# Patient Record
Sex: Female | Born: 1945 | Race: White | Hispanic: No | Marital: Married | Smoking: Never smoker
Health system: Southern US, Community
[De-identification: ages and names within clinical notes are randomized; demographics above are authoritative.]

## PROBLEM LIST (undated history)

## (undated) DIAGNOSIS — N183 Chronic kidney disease, stage 3 unspecified: Secondary | ICD-10-CM

## (undated) DIAGNOSIS — F0390 Unspecified dementia without behavioral disturbance: Secondary | ICD-10-CM

## (undated) DIAGNOSIS — K219 Gastro-esophageal reflux disease without esophagitis: Secondary | ICD-10-CM

## (undated) DIAGNOSIS — Z982 Presence of cerebrospinal fluid drainage device: Secondary | ICD-10-CM

## (undated) HISTORY — PX: ABDOMINAL HYSTERECTOMY: SHX81

## (undated) HISTORY — PX: LUMBAR FUSION: SHX111

---

## 2016-04-27 DIAGNOSIS — F0390 Unspecified dementia without behavioral disturbance: Secondary | ICD-10-CM | POA: Diagnosis not present

## 2016-07-12 DIAGNOSIS — M9901 Segmental and somatic dysfunction of cervical region: Secondary | ICD-10-CM | POA: Diagnosis not present

## 2016-07-12 DIAGNOSIS — M531 Cervicobrachial syndrome: Secondary | ICD-10-CM | POA: Diagnosis not present

## 2016-07-12 DIAGNOSIS — M5417 Radiculopathy, lumbosacral region: Secondary | ICD-10-CM | POA: Diagnosis not present

## 2016-07-12 DIAGNOSIS — M9903 Segmental and somatic dysfunction of lumbar region: Secondary | ICD-10-CM | POA: Diagnosis not present

## 2016-07-15 DIAGNOSIS — M9903 Segmental and somatic dysfunction of lumbar region: Secondary | ICD-10-CM | POA: Diagnosis not present

## 2016-07-15 DIAGNOSIS — M5417 Radiculopathy, lumbosacral region: Secondary | ICD-10-CM | POA: Diagnosis not present

## 2016-07-15 DIAGNOSIS — M9901 Segmental and somatic dysfunction of cervical region: Secondary | ICD-10-CM | POA: Diagnosis not present

## 2016-07-15 DIAGNOSIS — M531 Cervicobrachial syndrome: Secondary | ICD-10-CM | POA: Diagnosis not present

## 2016-07-19 DIAGNOSIS — M5417 Radiculopathy, lumbosacral region: Secondary | ICD-10-CM | POA: Diagnosis not present

## 2016-07-19 DIAGNOSIS — M531 Cervicobrachial syndrome: Secondary | ICD-10-CM | POA: Diagnosis not present

## 2016-07-19 DIAGNOSIS — M9901 Segmental and somatic dysfunction of cervical region: Secondary | ICD-10-CM | POA: Diagnosis not present

## 2016-07-19 DIAGNOSIS — M9903 Segmental and somatic dysfunction of lumbar region: Secondary | ICD-10-CM | POA: Diagnosis not present

## 2016-07-23 DIAGNOSIS — R072 Precordial pain: Secondary | ICD-10-CM | POA: Diagnosis not present

## 2016-07-23 DIAGNOSIS — Z79899 Other long term (current) drug therapy: Secondary | ICD-10-CM | POA: Diagnosis not present

## 2016-07-23 DIAGNOSIS — E039 Hypothyroidism, unspecified: Secondary | ICD-10-CM | POA: Diagnosis not present

## 2016-07-23 DIAGNOSIS — D1779 Benign lipomatous neoplasm of other sites: Secondary | ICD-10-CM | POA: Diagnosis not present

## 2016-07-23 DIAGNOSIS — R079 Chest pain, unspecified: Secondary | ICD-10-CM | POA: Diagnosis not present

## 2016-07-23 DIAGNOSIS — R0602 Shortness of breath: Secondary | ICD-10-CM | POA: Diagnosis not present

## 2016-07-23 DIAGNOSIS — E785 Hyperlipidemia, unspecified: Secondary | ICD-10-CM | POA: Diagnosis not present

## 2016-07-24 DIAGNOSIS — E785 Hyperlipidemia, unspecified: Secondary | ICD-10-CM | POA: Diagnosis not present

## 2016-07-24 DIAGNOSIS — R0602 Shortness of breath: Secondary | ICD-10-CM | POA: Diagnosis not present

## 2016-07-24 DIAGNOSIS — R079 Chest pain, unspecified: Secondary | ICD-10-CM | POA: Diagnosis not present

## 2016-07-27 DIAGNOSIS — D510 Vitamin B12 deficiency anemia due to intrinsic factor deficiency: Secondary | ICD-10-CM | POA: Diagnosis not present

## 2016-07-27 DIAGNOSIS — R1032 Left lower quadrant pain: Secondary | ICD-10-CM | POA: Diagnosis not present

## 2016-07-28 DIAGNOSIS — M9901 Segmental and somatic dysfunction of cervical region: Secondary | ICD-10-CM | POA: Diagnosis not present

## 2016-07-28 DIAGNOSIS — M9903 Segmental and somatic dysfunction of lumbar region: Secondary | ICD-10-CM | POA: Diagnosis not present

## 2016-07-28 DIAGNOSIS — M5417 Radiculopathy, lumbosacral region: Secondary | ICD-10-CM | POA: Diagnosis not present

## 2016-07-28 DIAGNOSIS — M531 Cervicobrachial syndrome: Secondary | ICD-10-CM | POA: Diagnosis not present

## 2016-08-04 DIAGNOSIS — E279 Disorder of adrenal gland, unspecified: Secondary | ICD-10-CM | POA: Diagnosis not present

## 2016-08-04 DIAGNOSIS — K5730 Diverticulosis of large intestine without perforation or abscess without bleeding: Secondary | ICD-10-CM | POA: Diagnosis not present

## 2016-08-04 DIAGNOSIS — K769 Liver disease, unspecified: Secondary | ICD-10-CM | POA: Diagnosis not present

## 2016-08-04 DIAGNOSIS — R1032 Left lower quadrant pain: Secondary | ICD-10-CM | POA: Diagnosis not present

## 2016-08-04 DIAGNOSIS — N200 Calculus of kidney: Secondary | ICD-10-CM | POA: Diagnosis not present

## 2016-08-04 DIAGNOSIS — I700 Atherosclerosis of aorta: Secondary | ICD-10-CM | POA: Diagnosis not present

## 2016-08-04 DIAGNOSIS — K439 Ventral hernia without obstruction or gangrene: Secondary | ICD-10-CM | POA: Diagnosis not present

## 2016-08-04 DIAGNOSIS — D739 Disease of spleen, unspecified: Secondary | ICD-10-CM | POA: Diagnosis not present

## 2016-08-09 DIAGNOSIS — D1803 Hemangioma of intra-abdominal structures: Secondary | ICD-10-CM | POA: Diagnosis not present

## 2016-08-09 DIAGNOSIS — E279 Disorder of adrenal gland, unspecified: Secondary | ICD-10-CM | POA: Diagnosis not present

## 2016-08-09 DIAGNOSIS — K8689 Other specified diseases of pancreas: Secondary | ICD-10-CM | POA: Diagnosis not present

## 2016-08-09 DIAGNOSIS — K769 Liver disease, unspecified: Secondary | ICD-10-CM | POA: Diagnosis not present

## 2016-08-25 DIAGNOSIS — M9901 Segmental and somatic dysfunction of cervical region: Secondary | ICD-10-CM | POA: Diagnosis not present

## 2016-08-25 DIAGNOSIS — M5417 Radiculopathy, lumbosacral region: Secondary | ICD-10-CM | POA: Diagnosis not present

## 2016-08-25 DIAGNOSIS — M531 Cervicobrachial syndrome: Secondary | ICD-10-CM | POA: Diagnosis not present

## 2016-08-25 DIAGNOSIS — M9903 Segmental and somatic dysfunction of lumbar region: Secondary | ICD-10-CM | POA: Diagnosis not present

## 2016-09-07 DIAGNOSIS — F0390 Unspecified dementia without behavioral disturbance: Secondary | ICD-10-CM | POA: Diagnosis not present

## 2016-09-07 DIAGNOSIS — F0781 Postconcussional syndrome: Secondary | ICD-10-CM | POA: Diagnosis not present

## 2016-09-17 DIAGNOSIS — E230 Hypopituitarism: Secondary | ICD-10-CM | POA: Diagnosis not present

## 2016-09-17 DIAGNOSIS — F0390 Unspecified dementia without behavioral disturbance: Secondary | ICD-10-CM | POA: Diagnosis not present

## 2016-09-17 DIAGNOSIS — K210 Gastro-esophageal reflux disease with esophagitis: Secondary | ICD-10-CM | POA: Diagnosis not present

## 2016-09-17 DIAGNOSIS — D510 Vitamin B12 deficiency anemia due to intrinsic factor deficiency: Secondary | ICD-10-CM | POA: Diagnosis not present

## 2016-09-17 DIAGNOSIS — E039 Hypothyroidism, unspecified: Secondary | ICD-10-CM | POA: Diagnosis not present

## 2016-09-22 DIAGNOSIS — M9901 Segmental and somatic dysfunction of cervical region: Secondary | ICD-10-CM | POA: Diagnosis not present

## 2016-09-22 DIAGNOSIS — M5417 Radiculopathy, lumbosacral region: Secondary | ICD-10-CM | POA: Diagnosis not present

## 2016-09-22 DIAGNOSIS — M9903 Segmental and somatic dysfunction of lumbar region: Secondary | ICD-10-CM | POA: Diagnosis not present

## 2016-09-22 DIAGNOSIS — M531 Cervicobrachial syndrome: Secondary | ICD-10-CM | POA: Diagnosis not present

## 2016-11-30 DIAGNOSIS — M25551 Pain in right hip: Secondary | ICD-10-CM | POA: Diagnosis not present

## 2016-11-30 DIAGNOSIS — M160 Bilateral primary osteoarthritis of hip: Secondary | ICD-10-CM | POA: Diagnosis not present

## 2016-12-02 DIAGNOSIS — M461 Sacroiliitis, not elsewhere classified: Secondary | ICD-10-CM | POA: Diagnosis not present

## 2016-12-06 DIAGNOSIS — M25551 Pain in right hip: Secondary | ICD-10-CM | POA: Diagnosis not present

## 2016-12-06 DIAGNOSIS — M2560 Stiffness of unspecified joint, not elsewhere classified: Secondary | ICD-10-CM | POA: Diagnosis not present

## 2016-12-06 DIAGNOSIS — M6281 Muscle weakness (generalized): Secondary | ICD-10-CM | POA: Diagnosis not present

## 2016-12-09 DIAGNOSIS — M533 Sacrococcygeal disorders, not elsewhere classified: Secondary | ICD-10-CM | POA: Diagnosis not present

## 2016-12-09 DIAGNOSIS — M461 Sacroiliitis, not elsewhere classified: Secondary | ICD-10-CM | POA: Diagnosis not present

## 2016-12-16 DIAGNOSIS — M25551 Pain in right hip: Secondary | ICD-10-CM | POA: Diagnosis not present

## 2016-12-16 DIAGNOSIS — M6281 Muscle weakness (generalized): Secondary | ICD-10-CM | POA: Diagnosis not present

## 2016-12-16 DIAGNOSIS — M2560 Stiffness of unspecified joint, not elsewhere classified: Secondary | ICD-10-CM | POA: Diagnosis not present

## 2016-12-22 DIAGNOSIS — M25551 Pain in right hip: Secondary | ICD-10-CM | POA: Diagnosis not present

## 2016-12-22 DIAGNOSIS — M6281 Muscle weakness (generalized): Secondary | ICD-10-CM | POA: Diagnosis not present

## 2016-12-22 DIAGNOSIS — M2560 Stiffness of unspecified joint, not elsewhere classified: Secondary | ICD-10-CM | POA: Diagnosis not present

## 2016-12-29 DIAGNOSIS — M6281 Muscle weakness (generalized): Secondary | ICD-10-CM | POA: Diagnosis not present

## 2016-12-29 DIAGNOSIS — H2513 Age-related nuclear cataract, bilateral: Secondary | ICD-10-CM | POA: Diagnosis not present

## 2016-12-29 DIAGNOSIS — M25551 Pain in right hip: Secondary | ICD-10-CM | POA: Diagnosis not present

## 2016-12-29 DIAGNOSIS — M2560 Stiffness of unspecified joint, not elsewhere classified: Secondary | ICD-10-CM | POA: Diagnosis not present

## 2016-12-31 DIAGNOSIS — M25551 Pain in right hip: Secondary | ICD-10-CM | POA: Diagnosis not present

## 2016-12-31 DIAGNOSIS — M2560 Stiffness of unspecified joint, not elsewhere classified: Secondary | ICD-10-CM | POA: Diagnosis not present

## 2016-12-31 DIAGNOSIS — M6281 Muscle weakness (generalized): Secondary | ICD-10-CM | POA: Diagnosis not present

## 2017-01-07 DIAGNOSIS — M6281 Muscle weakness (generalized): Secondary | ICD-10-CM | POA: Diagnosis not present

## 2017-01-07 DIAGNOSIS — M2560 Stiffness of unspecified joint, not elsewhere classified: Secondary | ICD-10-CM | POA: Diagnosis not present

## 2017-01-07 DIAGNOSIS — M25551 Pain in right hip: Secondary | ICD-10-CM | POA: Diagnosis not present

## 2017-01-12 DIAGNOSIS — M2560 Stiffness of unspecified joint, not elsewhere classified: Secondary | ICD-10-CM | POA: Diagnosis not present

## 2017-01-12 DIAGNOSIS — M6281 Muscle weakness (generalized): Secondary | ICD-10-CM | POA: Diagnosis not present

## 2017-01-12 DIAGNOSIS — M25551 Pain in right hip: Secondary | ICD-10-CM | POA: Diagnosis not present

## 2017-01-13 DIAGNOSIS — M461 Sacroiliitis, not elsewhere classified: Secondary | ICD-10-CM | POA: Diagnosis not present

## 2017-01-17 DIAGNOSIS — E039 Hypothyroidism, unspecified: Secondary | ICD-10-CM | POA: Diagnosis not present

## 2017-01-17 DIAGNOSIS — Z79899 Other long term (current) drug therapy: Secondary | ICD-10-CM | POA: Diagnosis not present

## 2017-01-17 DIAGNOSIS — D510 Vitamin B12 deficiency anemia due to intrinsic factor deficiency: Secondary | ICD-10-CM | POA: Diagnosis not present

## 2017-01-17 DIAGNOSIS — Z982 Presence of cerebrospinal fluid drainage device: Secondary | ICD-10-CM | POA: Diagnosis not present

## 2017-01-17 DIAGNOSIS — Z23 Encounter for immunization: Secondary | ICD-10-CM | POA: Diagnosis not present

## 2017-01-17 DIAGNOSIS — E2740 Unspecified adrenocortical insufficiency: Secondary | ICD-10-CM | POA: Diagnosis not present

## 2017-01-26 DIAGNOSIS — Z01818 Encounter for other preprocedural examination: Secondary | ICD-10-CM | POA: Diagnosis not present

## 2017-01-26 DIAGNOSIS — H35372 Puckering of macula, left eye: Secondary | ICD-10-CM | POA: Diagnosis not present

## 2017-01-26 DIAGNOSIS — H2512 Age-related nuclear cataract, left eye: Secondary | ICD-10-CM | POA: Diagnosis not present

## 2017-01-26 DIAGNOSIS — H25812 Combined forms of age-related cataract, left eye: Secondary | ICD-10-CM | POA: Diagnosis not present

## 2017-01-27 DIAGNOSIS — H25812 Combined forms of age-related cataract, left eye: Secondary | ICD-10-CM | POA: Diagnosis not present

## 2017-02-01 DIAGNOSIS — M9901 Segmental and somatic dysfunction of cervical region: Secondary | ICD-10-CM | POA: Diagnosis not present

## 2017-02-01 DIAGNOSIS — M531 Cervicobrachial syndrome: Secondary | ICD-10-CM | POA: Diagnosis not present

## 2017-02-01 DIAGNOSIS — M9903 Segmental and somatic dysfunction of lumbar region: Secondary | ICD-10-CM | POA: Diagnosis not present

## 2017-02-01 DIAGNOSIS — M5417 Radiculopathy, lumbosacral region: Secondary | ICD-10-CM | POA: Diagnosis not present

## 2017-02-07 DIAGNOSIS — H2511 Age-related nuclear cataract, right eye: Secondary | ICD-10-CM | POA: Diagnosis not present

## 2017-02-07 DIAGNOSIS — H25811 Combined forms of age-related cataract, right eye: Secondary | ICD-10-CM | POA: Diagnosis not present

## 2017-03-15 DIAGNOSIS — Z6828 Body mass index (BMI) 28.0-28.9, adult: Secondary | ICD-10-CM | POA: Diagnosis not present

## 2017-03-15 DIAGNOSIS — E2740 Unspecified adrenocortical insufficiency: Secondary | ICD-10-CM | POA: Diagnosis not present

## 2017-03-15 DIAGNOSIS — Z760 Encounter for issue of repeat prescription: Secondary | ICD-10-CM | POA: Diagnosis not present

## 2017-03-15 DIAGNOSIS — E039 Hypothyroidism, unspecified: Secondary | ICD-10-CM | POA: Diagnosis not present

## 2017-03-28 DIAGNOSIS — E279 Disorder of adrenal gland, unspecified: Secondary | ICD-10-CM | POA: Diagnosis not present

## 2017-03-28 DIAGNOSIS — E2740 Unspecified adrenocortical insufficiency: Secondary | ICD-10-CM | POA: Diagnosis not present

## 2017-03-28 DIAGNOSIS — E039 Hypothyroidism, unspecified: Secondary | ICD-10-CM | POA: Diagnosis not present

## 2017-04-29 DIAGNOSIS — F0781 Postconcussional syndrome: Secondary | ICD-10-CM | POA: Diagnosis not present

## 2017-04-29 DIAGNOSIS — R413 Other amnesia: Secondary | ICD-10-CM | POA: Diagnosis not present

## 2017-06-22 DIAGNOSIS — M9902 Segmental and somatic dysfunction of thoracic region: Secondary | ICD-10-CM | POA: Diagnosis not present

## 2017-06-22 DIAGNOSIS — M47816 Spondylosis without myelopathy or radiculopathy, lumbar region: Secondary | ICD-10-CM | POA: Diagnosis not present

## 2017-06-22 DIAGNOSIS — M9901 Segmental and somatic dysfunction of cervical region: Secondary | ICD-10-CM | POA: Diagnosis not present

## 2017-06-22 DIAGNOSIS — M9903 Segmental and somatic dysfunction of lumbar region: Secondary | ICD-10-CM | POA: Diagnosis not present

## 2017-06-23 DIAGNOSIS — M9901 Segmental and somatic dysfunction of cervical region: Secondary | ICD-10-CM | POA: Diagnosis not present

## 2017-06-23 DIAGNOSIS — M47816 Spondylosis without myelopathy or radiculopathy, lumbar region: Secondary | ICD-10-CM | POA: Diagnosis not present

## 2017-06-23 DIAGNOSIS — M9903 Segmental and somatic dysfunction of lumbar region: Secondary | ICD-10-CM | POA: Diagnosis not present

## 2017-06-23 DIAGNOSIS — M9902 Segmental and somatic dysfunction of thoracic region: Secondary | ICD-10-CM | POA: Diagnosis not present

## 2017-06-27 DIAGNOSIS — M9902 Segmental and somatic dysfunction of thoracic region: Secondary | ICD-10-CM | POA: Diagnosis not present

## 2017-06-27 DIAGNOSIS — M9901 Segmental and somatic dysfunction of cervical region: Secondary | ICD-10-CM | POA: Diagnosis not present

## 2017-06-27 DIAGNOSIS — M9903 Segmental and somatic dysfunction of lumbar region: Secondary | ICD-10-CM | POA: Diagnosis not present

## 2017-06-27 DIAGNOSIS — M47816 Spondylosis without myelopathy or radiculopathy, lumbar region: Secondary | ICD-10-CM | POA: Diagnosis not present

## 2017-06-28 DIAGNOSIS — M47816 Spondylosis without myelopathy or radiculopathy, lumbar region: Secondary | ICD-10-CM | POA: Diagnosis not present

## 2017-06-28 DIAGNOSIS — M9902 Segmental and somatic dysfunction of thoracic region: Secondary | ICD-10-CM | POA: Diagnosis not present

## 2017-06-28 DIAGNOSIS — M9901 Segmental and somatic dysfunction of cervical region: Secondary | ICD-10-CM | POA: Diagnosis not present

## 2017-06-28 DIAGNOSIS — M9903 Segmental and somatic dysfunction of lumbar region: Secondary | ICD-10-CM | POA: Diagnosis not present

## 2017-06-29 DIAGNOSIS — M9903 Segmental and somatic dysfunction of lumbar region: Secondary | ICD-10-CM | POA: Diagnosis not present

## 2017-06-29 DIAGNOSIS — M9901 Segmental and somatic dysfunction of cervical region: Secondary | ICD-10-CM | POA: Diagnosis not present

## 2017-06-29 DIAGNOSIS — M9902 Segmental and somatic dysfunction of thoracic region: Secondary | ICD-10-CM | POA: Diagnosis not present

## 2017-06-29 DIAGNOSIS — M47816 Spondylosis without myelopathy or radiculopathy, lumbar region: Secondary | ICD-10-CM | POA: Diagnosis not present

## 2017-07-04 DIAGNOSIS — M9903 Segmental and somatic dysfunction of lumbar region: Secondary | ICD-10-CM | POA: Diagnosis not present

## 2017-07-04 DIAGNOSIS — M47816 Spondylosis without myelopathy or radiculopathy, lumbar region: Secondary | ICD-10-CM | POA: Diagnosis not present

## 2017-07-04 DIAGNOSIS — M9902 Segmental and somatic dysfunction of thoracic region: Secondary | ICD-10-CM | POA: Diagnosis not present

## 2017-07-04 DIAGNOSIS — M9901 Segmental and somatic dysfunction of cervical region: Secondary | ICD-10-CM | POA: Diagnosis not present

## 2017-07-05 DIAGNOSIS — M9903 Segmental and somatic dysfunction of lumbar region: Secondary | ICD-10-CM | POA: Diagnosis not present

## 2017-07-05 DIAGNOSIS — M47816 Spondylosis without myelopathy or radiculopathy, lumbar region: Secondary | ICD-10-CM | POA: Diagnosis not present

## 2017-07-05 DIAGNOSIS — M9901 Segmental and somatic dysfunction of cervical region: Secondary | ICD-10-CM | POA: Diagnosis not present

## 2017-07-05 DIAGNOSIS — M9902 Segmental and somatic dysfunction of thoracic region: Secondary | ICD-10-CM | POA: Diagnosis not present

## 2017-07-06 DIAGNOSIS — M47816 Spondylosis without myelopathy or radiculopathy, lumbar region: Secondary | ICD-10-CM | POA: Diagnosis not present

## 2017-07-06 DIAGNOSIS — M9903 Segmental and somatic dysfunction of lumbar region: Secondary | ICD-10-CM | POA: Diagnosis not present

## 2017-07-06 DIAGNOSIS — M9902 Segmental and somatic dysfunction of thoracic region: Secondary | ICD-10-CM | POA: Diagnosis not present

## 2017-07-06 DIAGNOSIS — M9901 Segmental and somatic dysfunction of cervical region: Secondary | ICD-10-CM | POA: Diagnosis not present

## 2017-07-11 DIAGNOSIS — M9902 Segmental and somatic dysfunction of thoracic region: Secondary | ICD-10-CM | POA: Diagnosis not present

## 2017-07-11 DIAGNOSIS — M47816 Spondylosis without myelopathy or radiculopathy, lumbar region: Secondary | ICD-10-CM | POA: Diagnosis not present

## 2017-07-11 DIAGNOSIS — M9901 Segmental and somatic dysfunction of cervical region: Secondary | ICD-10-CM | POA: Diagnosis not present

## 2017-07-11 DIAGNOSIS — M9903 Segmental and somatic dysfunction of lumbar region: Secondary | ICD-10-CM | POA: Diagnosis not present

## 2017-07-13 DIAGNOSIS — M47816 Spondylosis without myelopathy or radiculopathy, lumbar region: Secondary | ICD-10-CM | POA: Diagnosis not present

## 2017-07-13 DIAGNOSIS — M9903 Segmental and somatic dysfunction of lumbar region: Secondary | ICD-10-CM | POA: Diagnosis not present

## 2017-07-13 DIAGNOSIS — M9902 Segmental and somatic dysfunction of thoracic region: Secondary | ICD-10-CM | POA: Diagnosis not present

## 2017-07-13 DIAGNOSIS — M9901 Segmental and somatic dysfunction of cervical region: Secondary | ICD-10-CM | POA: Diagnosis not present

## 2017-07-18 DIAGNOSIS — M47816 Spondylosis without myelopathy or radiculopathy, lumbar region: Secondary | ICD-10-CM | POA: Diagnosis not present

## 2017-07-18 DIAGNOSIS — M9901 Segmental and somatic dysfunction of cervical region: Secondary | ICD-10-CM | POA: Diagnosis not present

## 2017-07-18 DIAGNOSIS — M9903 Segmental and somatic dysfunction of lumbar region: Secondary | ICD-10-CM | POA: Diagnosis not present

## 2017-07-18 DIAGNOSIS — M9902 Segmental and somatic dysfunction of thoracic region: Secondary | ICD-10-CM | POA: Diagnosis not present

## 2017-07-20 DIAGNOSIS — M9903 Segmental and somatic dysfunction of lumbar region: Secondary | ICD-10-CM | POA: Diagnosis not present

## 2017-07-20 DIAGNOSIS — M9902 Segmental and somatic dysfunction of thoracic region: Secondary | ICD-10-CM | POA: Diagnosis not present

## 2017-07-20 DIAGNOSIS — M9901 Segmental and somatic dysfunction of cervical region: Secondary | ICD-10-CM | POA: Diagnosis not present

## 2017-07-20 DIAGNOSIS — M47816 Spondylosis without myelopathy or radiculopathy, lumbar region: Secondary | ICD-10-CM | POA: Diagnosis not present

## 2017-07-25 DIAGNOSIS — M9902 Segmental and somatic dysfunction of thoracic region: Secondary | ICD-10-CM | POA: Diagnosis not present

## 2017-07-25 DIAGNOSIS — M9901 Segmental and somatic dysfunction of cervical region: Secondary | ICD-10-CM | POA: Diagnosis not present

## 2017-07-25 DIAGNOSIS — M47816 Spondylosis without myelopathy or radiculopathy, lumbar region: Secondary | ICD-10-CM | POA: Diagnosis not present

## 2017-07-25 DIAGNOSIS — M9903 Segmental and somatic dysfunction of lumbar region: Secondary | ICD-10-CM | POA: Diagnosis not present

## 2017-09-06 DIAGNOSIS — E2740 Unspecified adrenocortical insufficiency: Secondary | ICD-10-CM | POA: Diagnosis not present

## 2017-09-06 DIAGNOSIS — Z79899 Other long term (current) drug therapy: Secondary | ICD-10-CM | POA: Diagnosis not present

## 2017-09-06 DIAGNOSIS — D510 Vitamin B12 deficiency anemia due to intrinsic factor deficiency: Secondary | ICD-10-CM | POA: Diagnosis not present

## 2017-09-06 DIAGNOSIS — Z982 Presence of cerebrospinal fluid drainage device: Secondary | ICD-10-CM | POA: Diagnosis not present

## 2017-09-06 DIAGNOSIS — E039 Hypothyroidism, unspecified: Secondary | ICD-10-CM | POA: Diagnosis not present

## 2017-09-06 DIAGNOSIS — K219 Gastro-esophageal reflux disease without esophagitis: Secondary | ICD-10-CM | POA: Diagnosis not present

## 2017-09-26 DIAGNOSIS — E039 Hypothyroidism, unspecified: Secondary | ICD-10-CM | POA: Diagnosis not present

## 2017-09-26 DIAGNOSIS — E2740 Unspecified adrenocortical insufficiency: Secondary | ICD-10-CM | POA: Diagnosis not present

## 2017-11-15 DIAGNOSIS — Z0000 Encounter for general adult medical examination without abnormal findings: Secondary | ICD-10-CM | POA: Diagnosis not present

## 2017-11-15 DIAGNOSIS — R05 Cough: Secondary | ICD-10-CM | POA: Diagnosis not present

## 2017-11-15 DIAGNOSIS — Z6829 Body mass index (BMI) 29.0-29.9, adult: Secondary | ICD-10-CM | POA: Diagnosis not present

## 2017-11-15 DIAGNOSIS — D510 Vitamin B12 deficiency anemia due to intrinsic factor deficiency: Secondary | ICD-10-CM | POA: Diagnosis not present

## 2017-11-30 DIAGNOSIS — M4316 Spondylolisthesis, lumbar region: Secondary | ICD-10-CM | POA: Diagnosis not present

## 2017-12-05 DIAGNOSIS — M545 Low back pain: Secondary | ICD-10-CM | POA: Diagnosis not present

## 2017-12-05 DIAGNOSIS — M4316 Spondylolisthesis, lumbar region: Secondary | ICD-10-CM | POA: Diagnosis not present

## 2017-12-05 DIAGNOSIS — M5116 Intervertebral disc disorders with radiculopathy, lumbar region: Secondary | ICD-10-CM | POA: Diagnosis not present

## 2017-12-05 DIAGNOSIS — M4800 Spinal stenosis, site unspecified: Secondary | ICD-10-CM | POA: Diagnosis not present

## 2017-12-07 DIAGNOSIS — M4316 Spondylolisthesis, lumbar region: Secondary | ICD-10-CM | POA: Diagnosis not present

## 2017-12-07 DIAGNOSIS — M5416 Radiculopathy, lumbar region: Secondary | ICD-10-CM | POA: Diagnosis not present

## 2017-12-09 DIAGNOSIS — M5416 Radiculopathy, lumbar region: Secondary | ICD-10-CM | POA: Diagnosis not present

## 2017-12-09 DIAGNOSIS — Z6830 Body mass index (BMI) 30.0-30.9, adult: Secondary | ICD-10-CM | POA: Diagnosis not present

## 2017-12-14 DIAGNOSIS — Z79899 Other long term (current) drug therapy: Secondary | ICD-10-CM | POA: Diagnosis not present

## 2017-12-14 DIAGNOSIS — D510 Vitamin B12 deficiency anemia due to intrinsic factor deficiency: Secondary | ICD-10-CM | POA: Diagnosis not present

## 2017-12-14 DIAGNOSIS — R52 Pain, unspecified: Secondary | ICD-10-CM | POA: Diagnosis not present

## 2017-12-14 DIAGNOSIS — Z01818 Encounter for other preprocedural examination: Secondary | ICD-10-CM | POA: Diagnosis not present

## 2017-12-14 DIAGNOSIS — E559 Vitamin D deficiency, unspecified: Secondary | ICD-10-CM | POA: Diagnosis not present

## 2017-12-14 DIAGNOSIS — M79603 Pain in arm, unspecified: Secondary | ICD-10-CM | POA: Diagnosis not present

## 2018-01-02 DIAGNOSIS — F320 Major depressive disorder, single episode, mild: Secondary | ICD-10-CM | POA: Diagnosis not present

## 2018-01-02 DIAGNOSIS — M461 Sacroiliitis, not elsewhere classified: Secondary | ICD-10-CM | POA: Diagnosis not present

## 2018-01-02 DIAGNOSIS — F329 Major depressive disorder, single episode, unspecified: Secondary | ICD-10-CM | POA: Diagnosis not present

## 2018-01-02 DIAGNOSIS — E039 Hypothyroidism, unspecified: Secondary | ICD-10-CM | POA: Diagnosis present

## 2018-01-02 DIAGNOSIS — Z01818 Encounter for other preprocedural examination: Secondary | ICD-10-CM | POA: Diagnosis not present

## 2018-01-02 DIAGNOSIS — Z79891 Long term (current) use of opiate analgesic: Secondary | ICD-10-CM | POA: Diagnosis not present

## 2018-01-02 DIAGNOSIS — M48061 Spinal stenosis, lumbar region without neurogenic claudication: Secondary | ICD-10-CM | POA: Diagnosis present

## 2018-01-02 DIAGNOSIS — M48062 Spinal stenosis, lumbar region with neurogenic claudication: Secondary | ICD-10-CM | POA: Diagnosis not present

## 2018-01-02 DIAGNOSIS — Z87828 Personal history of other (healed) physical injury and trauma: Secondary | ICD-10-CM | POA: Diagnosis not present

## 2018-01-02 DIAGNOSIS — Z7401 Bed confinement status: Secondary | ICD-10-CM | POA: Diagnosis not present

## 2018-01-02 DIAGNOSIS — M5416 Radiculopathy, lumbar region: Secondary | ICD-10-CM | POA: Diagnosis not present

## 2018-01-02 DIAGNOSIS — K219 Gastro-esophageal reflux disease without esophagitis: Secondary | ICD-10-CM | POA: Diagnosis present

## 2018-01-02 DIAGNOSIS — M818 Other osteoporosis without current pathological fracture: Secondary | ICD-10-CM | POA: Diagnosis not present

## 2018-01-02 DIAGNOSIS — M47816 Spondylosis without myelopathy or radiculopathy, lumbar region: Secondary | ICD-10-CM | POA: Diagnosis present

## 2018-01-02 DIAGNOSIS — M5116 Intervertebral disc disorders with radiculopathy, lumbar region: Secondary | ICD-10-CM | POA: Diagnosis present

## 2018-01-02 DIAGNOSIS — E785 Hyperlipidemia, unspecified: Secondary | ICD-10-CM | POA: Diagnosis not present

## 2018-01-02 DIAGNOSIS — F0390 Unspecified dementia without behavioral disturbance: Secondary | ICD-10-CM | POA: Diagnosis present

## 2018-01-02 DIAGNOSIS — D649 Anemia, unspecified: Secondary | ICD-10-CM | POA: Diagnosis not present

## 2018-01-02 DIAGNOSIS — E230 Hypopituitarism: Secondary | ICD-10-CM | POA: Diagnosis not present

## 2018-01-02 DIAGNOSIS — Z8719 Personal history of other diseases of the digestive system: Secondary | ICD-10-CM | POA: Diagnosis not present

## 2018-01-02 DIAGNOSIS — M4316 Spondylolisthesis, lumbar region: Secondary | ICD-10-CM | POA: Diagnosis present

## 2018-01-02 DIAGNOSIS — Z79899 Other long term (current) drug therapy: Secondary | ICD-10-CM | POA: Diagnosis not present

## 2018-01-02 DIAGNOSIS — E279 Disorder of adrenal gland, unspecified: Secondary | ICD-10-CM | POA: Diagnosis not present

## 2018-01-02 DIAGNOSIS — R5381 Other malaise: Secondary | ICD-10-CM | POA: Diagnosis not present

## 2018-01-05 DIAGNOSIS — G8918 Other acute postprocedural pain: Secondary | ICD-10-CM | POA: Diagnosis not present

## 2018-01-05 DIAGNOSIS — F0390 Unspecified dementia without behavioral disturbance: Secondary | ICD-10-CM | POA: Diagnosis not present

## 2018-01-05 DIAGNOSIS — Z8719 Personal history of other diseases of the digestive system: Secondary | ICD-10-CM | POA: Diagnosis not present

## 2018-01-05 DIAGNOSIS — E230 Hypopituitarism: Secondary | ICD-10-CM | POA: Diagnosis not present

## 2018-01-05 DIAGNOSIS — M549 Dorsalgia, unspecified: Secondary | ICD-10-CM | POA: Diagnosis not present

## 2018-01-05 DIAGNOSIS — D649 Anemia, unspecified: Secondary | ICD-10-CM | POA: Diagnosis not present

## 2018-01-05 DIAGNOSIS — E279 Disorder of adrenal gland, unspecified: Secondary | ICD-10-CM | POA: Diagnosis not present

## 2018-01-05 DIAGNOSIS — K219 Gastro-esophageal reflux disease without esophagitis: Secondary | ICD-10-CM | POA: Diagnosis not present

## 2018-01-05 DIAGNOSIS — Z01818 Encounter for other preprocedural examination: Secondary | ICD-10-CM | POA: Diagnosis not present

## 2018-01-05 DIAGNOSIS — M4316 Spondylolisthesis, lumbar region: Secondary | ICD-10-CM | POA: Diagnosis not present

## 2018-01-05 DIAGNOSIS — R262 Difficulty in walking, not elsewhere classified: Secondary | ICD-10-CM | POA: Diagnosis not present

## 2018-01-05 DIAGNOSIS — E785 Hyperlipidemia, unspecified: Secondary | ICD-10-CM | POA: Diagnosis not present

## 2018-01-05 DIAGNOSIS — Z7401 Bed confinement status: Secondary | ICD-10-CM | POA: Diagnosis not present

## 2018-01-05 DIAGNOSIS — M5416 Radiculopathy, lumbar region: Secondary | ICD-10-CM | POA: Diagnosis not present

## 2018-01-05 DIAGNOSIS — F329 Major depressive disorder, single episode, unspecified: Secondary | ICD-10-CM | POA: Diagnosis not present

## 2018-01-05 DIAGNOSIS — M461 Sacroiliitis, not elsewhere classified: Secondary | ICD-10-CM | POA: Diagnosis not present

## 2018-01-05 DIAGNOSIS — F320 Major depressive disorder, single episode, mild: Secondary | ICD-10-CM | POA: Diagnosis not present

## 2018-01-05 DIAGNOSIS — E039 Hypothyroidism, unspecified: Secondary | ICD-10-CM | POA: Diagnosis not present

## 2018-01-05 DIAGNOSIS — M818 Other osteoporosis without current pathological fracture: Secondary | ICD-10-CM | POA: Diagnosis not present

## 2018-01-05 DIAGNOSIS — R5381 Other malaise: Secondary | ICD-10-CM | POA: Diagnosis not present

## 2018-01-06 DIAGNOSIS — M549 Dorsalgia, unspecified: Secondary | ICD-10-CM | POA: Diagnosis not present

## 2018-01-06 DIAGNOSIS — G8918 Other acute postprocedural pain: Secondary | ICD-10-CM | POA: Diagnosis not present

## 2018-01-06 DIAGNOSIS — R262 Difficulty in walking, not elsewhere classified: Secondary | ICD-10-CM | POA: Diagnosis not present

## 2018-01-06 DIAGNOSIS — M5416 Radiculopathy, lumbar region: Secondary | ICD-10-CM | POA: Diagnosis not present

## 2018-01-20 DIAGNOSIS — M4316 Spondylolisthesis, lumbar region: Secondary | ICD-10-CM | POA: Diagnosis not present

## 2018-01-23 DIAGNOSIS — F0390 Unspecified dementia without behavioral disturbance: Secondary | ICD-10-CM | POA: Diagnosis not present

## 2018-01-23 DIAGNOSIS — E039 Hypothyroidism, unspecified: Secondary | ICD-10-CM | POA: Diagnosis not present

## 2018-01-23 DIAGNOSIS — Z4789 Encounter for other orthopedic aftercare: Secondary | ICD-10-CM | POA: Diagnosis not present

## 2018-01-23 DIAGNOSIS — M810 Age-related osteoporosis without current pathological fracture: Secondary | ICD-10-CM | POA: Diagnosis not present

## 2018-01-23 DIAGNOSIS — D649 Anemia, unspecified: Secondary | ICD-10-CM | POA: Diagnosis not present

## 2018-01-23 DIAGNOSIS — M48062 Spinal stenosis, lumbar region with neurogenic claudication: Secondary | ICD-10-CM | POA: Diagnosis not present

## 2018-01-24 DIAGNOSIS — M4316 Spondylolisthesis, lumbar region: Secondary | ICD-10-CM | POA: Diagnosis not present

## 2018-01-24 DIAGNOSIS — M5416 Radiculopathy, lumbar region: Secondary | ICD-10-CM | POA: Diagnosis not present

## 2018-01-25 DIAGNOSIS — F0390 Unspecified dementia without behavioral disturbance: Secondary | ICD-10-CM | POA: Diagnosis not present

## 2018-01-25 DIAGNOSIS — E039 Hypothyroidism, unspecified: Secondary | ICD-10-CM | POA: Diagnosis not present

## 2018-01-25 DIAGNOSIS — D649 Anemia, unspecified: Secondary | ICD-10-CM | POA: Diagnosis not present

## 2018-01-25 DIAGNOSIS — M48062 Spinal stenosis, lumbar region with neurogenic claudication: Secondary | ICD-10-CM | POA: Diagnosis not present

## 2018-01-25 DIAGNOSIS — M810 Age-related osteoporosis without current pathological fracture: Secondary | ICD-10-CM | POA: Diagnosis not present

## 2018-01-25 DIAGNOSIS — Z4789 Encounter for other orthopedic aftercare: Secondary | ICD-10-CM | POA: Diagnosis not present

## 2018-01-26 DIAGNOSIS — I96 Gangrene, not elsewhere classified: Secondary | ICD-10-CM | POA: Diagnosis present

## 2018-01-26 DIAGNOSIS — E039 Hypothyroidism, unspecified: Secondary | ICD-10-CM | POA: Diagnosis present

## 2018-01-26 DIAGNOSIS — Z7401 Bed confinement status: Secondary | ICD-10-CM | POA: Diagnosis not present

## 2018-01-26 DIAGNOSIS — R0989 Other specified symptoms and signs involving the circulatory and respiratory systems: Secondary | ICD-10-CM | POA: Diagnosis not present

## 2018-01-26 DIAGNOSIS — Y831 Surgical operation with implant of artificial internal device as the cause of abnormal reaction of the patient, or of later complication, without mention of misadventure at the time of the procedure: Secondary | ICD-10-CM | POA: Diagnosis not present

## 2018-01-26 DIAGNOSIS — M1611 Unilateral primary osteoarthritis, right hip: Secondary | ICD-10-CM | POA: Diagnosis not present

## 2018-01-26 DIAGNOSIS — Z981 Arthrodesis status: Secondary | ICD-10-CM | POA: Diagnosis not present

## 2018-01-26 DIAGNOSIS — M4316 Spondylolisthesis, lumbar region: Secondary | ICD-10-CM | POA: Diagnosis not present

## 2018-01-26 DIAGNOSIS — T8149XD Infection following a procedure, other surgical site, subsequent encounter: Secondary | ICD-10-CM | POA: Diagnosis not present

## 2018-01-26 DIAGNOSIS — R2681 Unsteadiness on feet: Secondary | ICD-10-CM | POA: Diagnosis not present

## 2018-01-26 DIAGNOSIS — F339 Major depressive disorder, recurrent, unspecified: Secondary | ICD-10-CM | POA: Diagnosis not present

## 2018-01-26 DIAGNOSIS — R2689 Other abnormalities of gait and mobility: Secondary | ICD-10-CM | POA: Diagnosis not present

## 2018-01-26 DIAGNOSIS — M2550 Pain in unspecified joint: Secondary | ICD-10-CM | POA: Diagnosis not present

## 2018-01-26 DIAGNOSIS — M6281 Muscle weakness (generalized): Secondary | ICD-10-CM | POA: Diagnosis not present

## 2018-01-26 DIAGNOSIS — E7800 Pure hypercholesterolemia, unspecified: Secondary | ICD-10-CM | POA: Diagnosis not present

## 2018-01-26 DIAGNOSIS — Z452 Encounter for adjustment and management of vascular access device: Secondary | ICD-10-CM | POA: Diagnosis not present

## 2018-01-26 DIAGNOSIS — T8149XA Infection following a procedure, other surgical site, initial encounter: Secondary | ICD-10-CM | POA: Diagnosis present

## 2018-01-26 DIAGNOSIS — Z741 Need for assistance with personal care: Secondary | ICD-10-CM | POA: Diagnosis not present

## 2018-01-26 DIAGNOSIS — Z79899 Other long term (current) drug therapy: Secondary | ICD-10-CM | POA: Diagnosis not present

## 2018-01-26 DIAGNOSIS — K219 Gastro-esophageal reflux disease without esophagitis: Secondary | ICD-10-CM | POA: Diagnosis present

## 2018-01-26 DIAGNOSIS — E785 Hyperlipidemia, unspecified: Secondary | ICD-10-CM | POA: Diagnosis present

## 2018-01-26 DIAGNOSIS — R41841 Cognitive communication deficit: Secondary | ICD-10-CM | POA: Diagnosis not present

## 2018-01-26 DIAGNOSIS — Z4789 Encounter for other orthopedic aftercare: Secondary | ICD-10-CM | POA: Diagnosis not present

## 2018-01-26 DIAGNOSIS — B9561 Methicillin susceptible Staphylococcus aureus infection as the cause of diseases classified elsewhere: Secondary | ICD-10-CM | POA: Diagnosis present

## 2018-01-26 DIAGNOSIS — B954 Other streptococcus as the cause of diseases classified elsewhere: Secondary | ICD-10-CM | POA: Diagnosis present

## 2018-01-26 DIAGNOSIS — T8132XA Disruption of internal operation (surgical) wound, not elsewhere classified, initial encounter: Secondary | ICD-10-CM | POA: Diagnosis not present

## 2018-01-26 DIAGNOSIS — F0390 Unspecified dementia without behavioral disturbance: Secondary | ICD-10-CM | POA: Diagnosis not present

## 2018-01-26 DIAGNOSIS — M545 Low back pain: Secondary | ICD-10-CM | POA: Diagnosis not present

## 2018-01-26 DIAGNOSIS — Z79891 Long term (current) use of opiate analgesic: Secondary | ICD-10-CM | POA: Diagnosis not present

## 2018-01-27 DIAGNOSIS — Y831 Surgical operation with implant of artificial internal device as the cause of abnormal reaction of the patient, or of later complication, without mention of misadventure at the time of the procedure: Secondary | ICD-10-CM | POA: Diagnosis not present

## 2018-01-27 DIAGNOSIS — I96 Gangrene, not elsewhere classified: Secondary | ICD-10-CM | POA: Diagnosis not present

## 2018-01-27 DIAGNOSIS — T8149XA Infection following a procedure, other surgical site, initial encounter: Secondary | ICD-10-CM | POA: Diagnosis not present

## 2018-01-28 DIAGNOSIS — I96 Gangrene, not elsewhere classified: Secondary | ICD-10-CM | POA: Diagnosis not present

## 2018-01-28 DIAGNOSIS — Y831 Surgical operation with implant of artificial internal device as the cause of abnormal reaction of the patient, or of later complication, without mention of misadventure at the time of the procedure: Secondary | ICD-10-CM | POA: Diagnosis not present

## 2018-01-28 DIAGNOSIS — T8149XA Infection following a procedure, other surgical site, initial encounter: Secondary | ICD-10-CM | POA: Diagnosis not present

## 2018-01-30 DIAGNOSIS — M2550 Pain in unspecified joint: Secondary | ICD-10-CM | POA: Diagnosis not present

## 2018-01-30 DIAGNOSIS — M4316 Spondylolisthesis, lumbar region: Secondary | ICD-10-CM | POA: Diagnosis not present

## 2018-01-30 DIAGNOSIS — Z4789 Encounter for other orthopedic aftercare: Secondary | ICD-10-CM | POA: Diagnosis not present

## 2018-01-30 DIAGNOSIS — M1611 Unilateral primary osteoarthritis, right hip: Secondary | ICD-10-CM | POA: Diagnosis not present

## 2018-01-30 DIAGNOSIS — M47816 Spondylosis without myelopathy or radiculopathy, lumbar region: Secondary | ICD-10-CM | POA: Diagnosis not present

## 2018-01-30 DIAGNOSIS — E039 Hypothyroidism, unspecified: Secondary | ICD-10-CM | POA: Diagnosis not present

## 2018-01-30 DIAGNOSIS — R2681 Unsteadiness on feet: Secondary | ICD-10-CM | POA: Diagnosis not present

## 2018-01-30 DIAGNOSIS — D649 Anemia, unspecified: Secondary | ICD-10-CM | POA: Diagnosis not present

## 2018-01-30 DIAGNOSIS — T8463XD Infection and inflammatory reaction due to internal fixation device of spine, subsequent encounter: Secondary | ICD-10-CM | POA: Diagnosis not present

## 2018-01-30 DIAGNOSIS — M6281 Muscle weakness (generalized): Secondary | ICD-10-CM | POA: Diagnosis not present

## 2018-01-30 DIAGNOSIS — M545 Low back pain: Secondary | ICD-10-CM | POA: Diagnosis not present

## 2018-01-30 DIAGNOSIS — R5383 Other fatigue: Secondary | ICD-10-CM | POA: Diagnosis not present

## 2018-01-30 DIAGNOSIS — Z981 Arthrodesis status: Secondary | ICD-10-CM | POA: Diagnosis not present

## 2018-01-30 DIAGNOSIS — M549 Dorsalgia, unspecified: Secondary | ICD-10-CM | POA: Diagnosis not present

## 2018-01-30 DIAGNOSIS — R41841 Cognitive communication deficit: Secondary | ICD-10-CM | POA: Diagnosis not present

## 2018-01-30 DIAGNOSIS — F0390 Unspecified dementia without behavioral disturbance: Secondary | ICD-10-CM | POA: Diagnosis not present

## 2018-01-30 DIAGNOSIS — T8142XD Infection following a procedure, deep incisional surgical site, subsequent encounter: Secondary | ICD-10-CM | POA: Diagnosis not present

## 2018-01-30 DIAGNOSIS — R2689 Other abnormalities of gait and mobility: Secondary | ICD-10-CM | POA: Diagnosis not present

## 2018-01-30 DIAGNOSIS — N63 Unspecified lump in unspecified breast: Secondary | ICD-10-CM | POA: Diagnosis not present

## 2018-01-30 DIAGNOSIS — Y831 Surgical operation with implant of artificial internal device as the cause of abnormal reaction of the patient, or of later complication, without mention of misadventure at the time of the procedure: Secondary | ICD-10-CM | POA: Diagnosis not present

## 2018-01-30 DIAGNOSIS — I96 Gangrene, not elsewhere classified: Secondary | ICD-10-CM | POA: Diagnosis not present

## 2018-01-30 DIAGNOSIS — Z885 Allergy status to narcotic agent status: Secondary | ICD-10-CM | POA: Diagnosis not present

## 2018-01-30 DIAGNOSIS — Z7401 Bed confinement status: Secondary | ICD-10-CM | POA: Diagnosis not present

## 2018-01-30 DIAGNOSIS — Z95828 Presence of other vascular implants and grafts: Secondary | ICD-10-CM | POA: Diagnosis not present

## 2018-01-30 DIAGNOSIS — T8149XA Infection following a procedure, other surgical site, initial encounter: Secondary | ICD-10-CM | POA: Diagnosis not present

## 2018-01-30 DIAGNOSIS — B9561 Methicillin susceptible Staphylococcus aureus infection as the cause of diseases classified elsewhere: Secondary | ICD-10-CM | POA: Diagnosis not present

## 2018-01-30 DIAGNOSIS — T8149XD Infection following a procedure, other surgical site, subsequent encounter: Secondary | ICD-10-CM | POA: Diagnosis not present

## 2018-01-30 DIAGNOSIS — F339 Major depressive disorder, recurrent, unspecified: Secondary | ICD-10-CM | POA: Diagnosis not present

## 2018-01-30 DIAGNOSIS — B954 Other streptococcus as the cause of diseases classified elsewhere: Secondary | ICD-10-CM | POA: Diagnosis not present

## 2018-01-30 DIAGNOSIS — Z741 Need for assistance with personal care: Secondary | ICD-10-CM | POA: Diagnosis not present

## 2018-01-31 DIAGNOSIS — T8142XD Infection following a procedure, deep incisional surgical site, subsequent encounter: Secondary | ICD-10-CM | POA: Diagnosis not present

## 2018-01-31 DIAGNOSIS — M549 Dorsalgia, unspecified: Secondary | ICD-10-CM | POA: Diagnosis not present

## 2018-01-31 DIAGNOSIS — M47816 Spondylosis without myelopathy or radiculopathy, lumbar region: Secondary | ICD-10-CM | POA: Diagnosis not present

## 2018-01-31 DIAGNOSIS — Z981 Arthrodesis status: Secondary | ICD-10-CM | POA: Diagnosis not present

## 2018-02-02 DIAGNOSIS — M4316 Spondylolisthesis, lumbar region: Secondary | ICD-10-CM | POA: Diagnosis not present

## 2018-02-07 ENCOUNTER — Ambulatory Visit: Payer: Medicare Other | Admitting: Infectious Disease

## 2018-02-09 ENCOUNTER — Encounter: Payer: Self-pay | Admitting: Internal Medicine

## 2018-02-09 ENCOUNTER — Ambulatory Visit (INDEPENDENT_AMBULATORY_CARE_PROVIDER_SITE_OTHER): Payer: Medicare Other | Admitting: Internal Medicine

## 2018-02-09 DIAGNOSIS — Z981 Arthrodesis status: Secondary | ICD-10-CM | POA: Diagnosis not present

## 2018-02-09 DIAGNOSIS — T8463XD Infection and inflammatory reaction due to internal fixation device of spine, subsequent encounter: Secondary | ICD-10-CM

## 2018-02-09 DIAGNOSIS — D649 Anemia, unspecified: Secondary | ICD-10-CM | POA: Insufficient documentation

## 2018-02-09 DIAGNOSIS — Z885 Allergy status to narcotic agent status: Secondary | ICD-10-CM | POA: Diagnosis not present

## 2018-02-09 DIAGNOSIS — B954 Other streptococcus as the cause of diseases classified elsewhere: Secondary | ICD-10-CM | POA: Diagnosis not present

## 2018-02-09 DIAGNOSIS — T8149XD Infection following a procedure, other surgical site, subsequent encounter: Secondary | ICD-10-CM | POA: Diagnosis not present

## 2018-02-09 DIAGNOSIS — T8149XA Infection following a procedure, other surgical site, initial encounter: Secondary | ICD-10-CM

## 2018-02-09 DIAGNOSIS — B9561 Methicillin susceptible Staphylococcus aureus infection as the cause of diseases classified elsewhere: Secondary | ICD-10-CM

## 2018-02-09 DIAGNOSIS — F0390 Unspecified dementia without behavioral disturbance: Secondary | ICD-10-CM

## 2018-02-09 DIAGNOSIS — Z95828 Presence of other vascular implants and grafts: Secondary | ICD-10-CM

## 2018-02-09 NOTE — Progress Notes (Signed)
[facility] for Infectious Disease  Reason for Consult: Postoperative lumbar wound infection Referring Provider: Dr. Creig Hines  Assessment: Ms. 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  I favor changing to ceftriaxone to improve coverage for MSSA and adding rifampin given the presence of hardware.  I will see her back in 4 weeks.   Plan: 1. Change ceftriaxone to cefazolin 2 g IV every 8 hours 2. Rifampin 600 mg by mouth daily 3. CBC, BMP, sed rate and C-reactive protein weekly 4. Follow-up here in 4 weeks  Patient Active Problem List   Diagnosis Date Noted  . S/P lumbar fusion 02/09/2018    Priority: High  . Postoperative wound infection 02/09/2018    Priority: High  . Dementia ([place]) 02/09/2018  . Normocytic anemia 02/09/2018    Patient's Medications  New Prescriptions   No medications on file  Previous Medications   CEFTRIAXONE (ROCEPHIN) IVPB    Inject 1 g into the vein.   CYANOCOBALAMIN (,VITAMIN B-12,) 1000 MCG/ML INJECTION    Inject into the muscle.   DONEPEZIL (ARICEPT) 10 MG TABLET    Take by mouth.   GABAPENTIN (NEURONTIN) 300 MG CAPSULE       LEVOTHYROXINE (SYNTHROID, LEVOTHROID) 25 MCG TABLET    Take by mouth.   MEMANTINE (NAMENDA) 10 MG TABLET    Take by mouth.   METHYLPREDNISOLONE (MEDROL DOSEPAK) 4 MG TBPK TABLET    See admin instructions. see package   OXYCODONE (OXY IR/ROXICODONE) 5 MG IMMEDIATE RELEASE TABLET       PANTOPRAZOLE (PROTONIX) 40 MG TABLET    Take 40 mg by mouth daily.   RANITIDINE (ZANTAC) 150 MG TABLET    Take by mouth.   SERTRALINE (ZOLOFT) 50 MG TABLET    Take 50 mg by mouth daily.   TRAMADOL (ULTRAM) 50 MG TABLET      Modified Medications   No medications on file  Discontinued Medications   No medications on file    HPI: Yetunde Leis is a 72 y.o. female with degenerative spine disease and chronic back pain who  underwent lumbar fusion on 01/02/2018.  She did well initially until developing spontaneous drainage from her wound leading to readmission on 01/26/2018.  She underwent incision and drainage with debridement beneath the fascia.  Operative cultures grew MSSA and group G strep she was discharged 4 days later on IV ceftriaxone.  She has not had any fever, chills or sweats.  She continues to have severe back pain that radiates down her right leg.  This has limited her ability to participate in physical therapy.  She has not had any problems tolerating her ceftriaxone or PICC.  Review of Systems: Review of Systems  Constitutional: Negative for chills, diaphoresis and fever.  Gastrointestinal: Negative for abdominal pain, diarrhea, nausea and vomiting.  Musculoskeletal: Positive for back pain.  Skin: Negative for rash.      No past medical history on file.  Social History   Tobacco Use  . Smoking status: Never Smoker  . Smokeless tobacco: Never Used  Substance Use Topics  . Alcohol use: Not on file  . Drug use: Not on file    No family history on file. Allergies  Allergen Reactions  . Codeine Other (See Comments)    OBJECTIVE: Vitals:   02/09/18 1448  BP: 106/71  Pulse: 92  Temp: 99.6 F (37.6 C)  There is no height or weight on file to calculate BMI.   Physical Exam  Constitutional: She is oriented to person, place, and time.  She is very pleasant and in no distress.  She is seated in a wheelchair.  She is accompanied by her husband.  She defers to her husband to answer most questions.  Musculoskeletal:  She has a clean dry gauze dressing over her lumbar incision.  Neurological: She is alert and oriented to person, place, and time.  Skin: No rash noted.  Right arm PICC site looks good.  Psychiatric: She has a normal mood and affect.    Microbiology: No results found for this or any previous visit (from the past 240 hour(s)).  Michel Bickers, MD [facility] for  [facility] [phone] pager   [phone] cell 02/09/2018, 3:32 PM

## 2018-02-09 NOTE — Assessment & Plan Note (Signed)
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  I favor changing to ceftriaxone to improve coverage for MSSA and adding rifampin given the presence of hardware.  I will see her back in 4 weeks.

## 2018-02-14 DIAGNOSIS — N63 Unspecified lump in unspecified breast: Secondary | ICD-10-CM | POA: Diagnosis not present

## 2018-02-28 DIAGNOSIS — R5383 Other fatigue: Secondary | ICD-10-CM | POA: Diagnosis not present

## 2018-02-28 DIAGNOSIS — D649 Anemia, unspecified: Secondary | ICD-10-CM | POA: Diagnosis not present

## 2018-02-28 DIAGNOSIS — E039 Hypothyroidism, unspecified: Secondary | ICD-10-CM | POA: Diagnosis not present

## 2018-03-13 ENCOUNTER — Ambulatory Visit (INDEPENDENT_AMBULATORY_CARE_PROVIDER_SITE_OTHER): Payer: Medicare Other | Admitting: Internal Medicine

## 2018-03-13 ENCOUNTER — Encounter: Payer: Self-pay | Admitting: Internal Medicine

## 2018-03-13 DIAGNOSIS — T8149XA Infection following a procedure, other surgical site, initial encounter: Secondary | ICD-10-CM

## 2018-03-13 MED ORDER — RIFAMPIN 300 MG PO CAPS: 300.0000 mg | ORAL_CAPSULE | Freq: Two times a day (BID) | ORAL | 0 refills | Status: DC

## 2018-03-13 MED ORDER — CEPHALEXIN 500 MG PO CAPS: 500.0000 mg | ORAL_CAPSULE | Freq: Three times a day (TID) | ORAL | 0 refills | Status: DC

## 2018-03-13 NOTE — Addendum Note (Signed)
Addended by: Michel Bickers on: 03/13/2018 03:20 PM   Modules accepted: Orders

## 2018-03-13 NOTE — Progress Notes (Signed)
Per verbal order from Dr Megan Salon, 36 cm Single Lumen Peripherally Inserted Central Catheter removed from right basilic, tip intact. No sutures present. RN unable to confirm length per chart as PICC was placed at outside facility. Dressing was clean and dry; however there was a flat, light red raised rash in her antecubital space. Dr Megan Salon looked at the area, advised patient's husband to call [facility] if it got worse. Petroleum dressing applied. Patient's husband advised no heavy lifting with this arm, leave dressing for 24 hours and call the office or seek emergent care if dressing becomes soaked with blood or swelling or sharp pain presents. Patient verbalized understanding and agreement.  Patient's husband's questions answered to their satisfaction. Patient tolerated procedure well. Landis Gandy, RN

## 2018-03-13 NOTE — Assessment & Plan Note (Signed)
She has improved clinically but her inflammatory markers remain quite elevated.  I will discontinue IV cefazolin and have her PICC removed today.  I will continue her on an oral regimen of cephalexin and rifampin for 4 more weeks.  She will follow-up here in 6 weeks.

## 2018-03-13 NOTE — Progress Notes (Signed)
[facility] for Infectious Disease  Patient Active Problem List   Diagnosis Date Noted  . S/P lumbar fusion 02/09/2018    Priority: High  . Postoperative wound infection 02/09/2018    Priority: High  . Dementia ([place]) 02/09/2018  . Normocytic anemia 02/09/2018    Patient's Medications  New Prescriptions   CEPHALEXIN (KEFLEX) 500 MG CAPSULE    Take 1 capsule (500 mg total) by mouth 3 (three) times daily.  Previous Medications   CYANOCOBALAMIN (,VITAMIN B-12,) 1000 MCG/ML INJECTION    Inject into the muscle.   DONEPEZIL (ARICEPT) 10 MG TABLET    Take by mouth.   GABAPENTIN (NEURONTIN) 300 MG CAPSULE       LEVOTHYROXINE (SYNTHROID, LEVOTHROID) 25 MCG TABLET    Take by mouth.   MEMANTINE (NAMENDA) 10 MG TABLET    Take by mouth.   METHYLPREDNISOLONE (MEDROL DOSEPAK) 4 MG TBPK TABLET    See admin instructions. see package   OXYCODONE (OXY IR/ROXICODONE) 5 MG IMMEDIATE RELEASE TABLET       PANTOPRAZOLE (PROTONIX) 40 MG TABLET    Take 40 mg by mouth daily.   RANITIDINE (ZANTAC) 150 MG TABLET    Take by mouth.   SERTRALINE (ZOLOFT) 50 MG TABLET    Take 50 mg by mouth daily.   TRAMADOL (ULTRAM) 50 MG TABLET      Modified Medications   No medications on file  Discontinued Medications   CEFAZOLIN (ANCEF) IVPB    Inject 2 g into the vein every 8 (eight) hours.   CEFTRIAXONE (ROCEPHIN) IVPB    Inject 1 g into the vein.    Subjective:         Renee Raymond is in for her routine visit. She underwent lumbar fusion on 01/02/2018.  She did well initially until developing spontaneous drainage from her wound leading to readmission on 01/26/2018.  She underwent incision and drainage with debridement beneath the fascia. Operative cultures grew MSSA and group G strep she was discharged 4 days later on IV cefazolin.    I added oral rifampin on 02/09/2018.  She has now completed 46 days of total antibiotic therapy.  She is not able to give much history because of her dementia but her  husband states that her pain has been under much better control in the past 10 days.  He does not believe she has been needing pain medication.  Unfortunately her physical therapy was stopped recently after she had all approved sessions.  She remains largely bedbound and extremely weak.  She has not had any fever, chills or sweats.  She has not had any problems tolerating her antibiotics or PICC.   Review of Systems: Review of Systems  Constitutional: Positive for malaise/fatigue. Negative for chills, diaphoresis and fever.  Genitourinary:       Her back pain is much improvement.  Musculoskeletal: Positive for back pain.  Skin: Negative for rash.  Neurological: Positive for weakness.    No past medical history on file.  Social History   Tobacco Use  . Smoking status: Never Smoker  . Smokeless tobacco: Never Used  Substance Use Topics  . Alcohol use: Not on file  . Drug use: Not on file    No family history on file.  Allergies  Allergen Reactions  . Codeine Other (See Comments)    Objective: Vitals:   03/13/18 1445  Weight: 128 lb (58.1 kg)  Height: 4\' 10"  (1.473 m)   Body  mass index is 26.75 kg/m.  Physical Exam  Constitutional:  He is cheerful and in no distress.  She is seated in a wheelchair.  She largely defers to her husband to answer questions.  Musculoskeletal:  Her lumbar incision has healed nicely.  Neurological: She is alert.  Skin: No rash noted.  Psychiatric: She has a normal mood and affect.    Lab Results Sedimentation rate 03/10/2018: 86 C-reactive protein 03/10/2018: Greater than 80   Problem List Items Addressed This Visit      High   Postoperative wound infection    She has improved clinically but her inflammatory markers remain quite elevated.  I will discontinue IV cefazolin and have her PICC removed today.  I will continue her on an oral regimen of cephalexin and rifampin for 4 more weeks.  She will follow-up here in 6 weeks.       Relevant Medications   cephALEXin (KEFLEX) 500 MG capsule       Renee Bickers, MD [facility] for Infectious [place] [phone] pager   [phone] cell 03/13/2018, 3:14 PM

## 2018-03-15 DIAGNOSIS — Z4789 Encounter for other orthopedic aftercare: Secondary | ICD-10-CM | POA: Diagnosis not present

## 2018-03-15 DIAGNOSIS — M48062 Spinal stenosis, lumbar region with neurogenic claudication: Secondary | ICD-10-CM | POA: Diagnosis not present

## 2018-03-15 DIAGNOSIS — F0390 Unspecified dementia without behavioral disturbance: Secondary | ICD-10-CM | POA: Diagnosis not present

## 2018-03-15 DIAGNOSIS — D649 Anemia, unspecified: Secondary | ICD-10-CM | POA: Diagnosis not present

## 2018-03-15 DIAGNOSIS — M810 Age-related osteoporosis without current pathological fracture: Secondary | ICD-10-CM | POA: Diagnosis not present

## 2018-03-15 DIAGNOSIS — E039 Hypothyroidism, unspecified: Secondary | ICD-10-CM | POA: Diagnosis not present

## 2018-03-17 DIAGNOSIS — M810 Age-related osteoporosis without current pathological fracture: Secondary | ICD-10-CM | POA: Diagnosis not present

## 2018-03-17 DIAGNOSIS — E039 Hypothyroidism, unspecified: Secondary | ICD-10-CM | POA: Diagnosis not present

## 2018-03-17 DIAGNOSIS — F0390 Unspecified dementia without behavioral disturbance: Secondary | ICD-10-CM | POA: Diagnosis not present

## 2018-03-17 DIAGNOSIS — D649 Anemia, unspecified: Secondary | ICD-10-CM | POA: Diagnosis not present

## 2018-03-17 DIAGNOSIS — Z4789 Encounter for other orthopedic aftercare: Secondary | ICD-10-CM | POA: Diagnosis not present

## 2018-03-17 DIAGNOSIS — M48062 Spinal stenosis, lumbar region with neurogenic claudication: Secondary | ICD-10-CM | POA: Diagnosis not present

## 2018-03-20 DIAGNOSIS — M48062 Spinal stenosis, lumbar region with neurogenic claudication: Secondary | ICD-10-CM | POA: Diagnosis not present

## 2018-03-20 DIAGNOSIS — M810 Age-related osteoporosis without current pathological fracture: Secondary | ICD-10-CM | POA: Diagnosis not present

## 2018-03-20 DIAGNOSIS — E039 Hypothyroidism, unspecified: Secondary | ICD-10-CM | POA: Diagnosis not present

## 2018-03-20 DIAGNOSIS — F0390 Unspecified dementia without behavioral disturbance: Secondary | ICD-10-CM | POA: Diagnosis not present

## 2018-03-20 DIAGNOSIS — Z4789 Encounter for other orthopedic aftercare: Secondary | ICD-10-CM | POA: Diagnosis not present

## 2018-03-20 DIAGNOSIS — D649 Anemia, unspecified: Secondary | ICD-10-CM | POA: Diagnosis not present

## 2018-03-21 DIAGNOSIS — T8149XA Infection following a procedure, other surgical site, initial encounter: Secondary | ICD-10-CM | POA: Diagnosis not present

## 2018-03-21 DIAGNOSIS — Z09 Encounter for follow-up examination after completed treatment for conditions other than malignant neoplasm: Secondary | ICD-10-CM | POA: Diagnosis not present

## 2018-03-21 DIAGNOSIS — F0390 Unspecified dementia without behavioral disturbance: Secondary | ICD-10-CM | POA: Diagnosis not present

## 2018-03-21 DIAGNOSIS — Z6825 Body mass index (BMI) 25.0-25.9, adult: Secondary | ICD-10-CM | POA: Diagnosis not present

## 2018-03-22 DIAGNOSIS — E039 Hypothyroidism, unspecified: Secondary | ICD-10-CM | POA: Diagnosis not present

## 2018-03-22 DIAGNOSIS — F0390 Unspecified dementia without behavioral disturbance: Secondary | ICD-10-CM | POA: Diagnosis not present

## 2018-03-22 DIAGNOSIS — Z4789 Encounter for other orthopedic aftercare: Secondary | ICD-10-CM | POA: Diagnosis not present

## 2018-03-22 DIAGNOSIS — M48062 Spinal stenosis, lumbar region with neurogenic claudication: Secondary | ICD-10-CM | POA: Diagnosis not present

## 2018-03-22 DIAGNOSIS — M810 Age-related osteoporosis without current pathological fracture: Secondary | ICD-10-CM | POA: Diagnosis not present

## 2018-03-22 DIAGNOSIS — D649 Anemia, unspecified: Secondary | ICD-10-CM | POA: Diagnosis not present

## 2018-03-23 DIAGNOSIS — M810 Age-related osteoporosis without current pathological fracture: Secondary | ICD-10-CM | POA: Diagnosis not present

## 2018-03-23 DIAGNOSIS — D649 Anemia, unspecified: Secondary | ICD-10-CM | POA: Diagnosis not present

## 2018-03-23 DIAGNOSIS — Z4789 Encounter for other orthopedic aftercare: Secondary | ICD-10-CM | POA: Diagnosis not present

## 2018-03-23 DIAGNOSIS — E039 Hypothyroidism, unspecified: Secondary | ICD-10-CM | POA: Diagnosis not present

## 2018-03-23 DIAGNOSIS — M48062 Spinal stenosis, lumbar region with neurogenic claudication: Secondary | ICD-10-CM | POA: Diagnosis not present

## 2018-03-23 DIAGNOSIS — F0390 Unspecified dementia without behavioral disturbance: Secondary | ICD-10-CM | POA: Diagnosis not present

## 2018-03-24 DIAGNOSIS — E785 Hyperlipidemia, unspecified: Secondary | ICD-10-CM | POA: Diagnosis not present

## 2018-03-24 DIAGNOSIS — F0390 Unspecified dementia without behavioral disturbance: Secondary | ICD-10-CM | POA: Diagnosis not present

## 2018-03-24 DIAGNOSIS — E43 Unspecified severe protein-calorie malnutrition: Secondary | ICD-10-CM | POA: Diagnosis not present

## 2018-03-24 DIAGNOSIS — D649 Anemia, unspecified: Secondary | ICD-10-CM | POA: Diagnosis not present

## 2018-03-24 DIAGNOSIS — F339 Major depressive disorder, recurrent, unspecified: Secondary | ICD-10-CM | POA: Diagnosis not present

## 2018-03-24 DIAGNOSIS — M810 Age-related osteoporosis without current pathological fracture: Secondary | ICD-10-CM | POA: Diagnosis not present

## 2018-03-24 DIAGNOSIS — T8140XD Infection following a procedure, unspecified, subsequent encounter: Secondary | ICD-10-CM | POA: Diagnosis not present

## 2018-03-24 DIAGNOSIS — E039 Hypothyroidism, unspecified: Secondary | ICD-10-CM | POA: Diagnosis not present

## 2018-03-28 DIAGNOSIS — E039 Hypothyroidism, unspecified: Secondary | ICD-10-CM | POA: Diagnosis not present

## 2018-03-28 DIAGNOSIS — F339 Major depressive disorder, recurrent, unspecified: Secondary | ICD-10-CM | POA: Diagnosis not present

## 2018-03-28 DIAGNOSIS — F0390 Unspecified dementia without behavioral disturbance: Secondary | ICD-10-CM | POA: Diagnosis not present

## 2018-03-28 DIAGNOSIS — T8140XD Infection following a procedure, unspecified, subsequent encounter: Secondary | ICD-10-CM | POA: Diagnosis not present

## 2018-03-28 DIAGNOSIS — D649 Anemia, unspecified: Secondary | ICD-10-CM | POA: Diagnosis not present

## 2018-03-28 DIAGNOSIS — M810 Age-related osteoporosis without current pathological fracture: Secondary | ICD-10-CM | POA: Diagnosis not present

## 2018-03-29 DIAGNOSIS — D649 Anemia, unspecified: Secondary | ICD-10-CM | POA: Diagnosis not present

## 2018-03-29 DIAGNOSIS — T8140XD Infection following a procedure, unspecified, subsequent encounter: Secondary | ICD-10-CM | POA: Diagnosis not present

## 2018-03-29 DIAGNOSIS — M810 Age-related osteoporosis without current pathological fracture: Secondary | ICD-10-CM | POA: Diagnosis not present

## 2018-03-29 DIAGNOSIS — F0390 Unspecified dementia without behavioral disturbance: Secondary | ICD-10-CM | POA: Diagnosis not present

## 2018-03-29 DIAGNOSIS — E039 Hypothyroidism, unspecified: Secondary | ICD-10-CM | POA: Diagnosis not present

## 2018-03-29 DIAGNOSIS — F339 Major depressive disorder, recurrent, unspecified: Secondary | ICD-10-CM | POA: Diagnosis not present

## 2018-03-30 DIAGNOSIS — D649 Anemia, unspecified: Secondary | ICD-10-CM | POA: Diagnosis not present

## 2018-03-30 DIAGNOSIS — M810 Age-related osteoporosis without current pathological fracture: Secondary | ICD-10-CM | POA: Diagnosis not present

## 2018-03-30 DIAGNOSIS — T8140XD Infection following a procedure, unspecified, subsequent encounter: Secondary | ICD-10-CM | POA: Diagnosis not present

## 2018-03-30 DIAGNOSIS — F339 Major depressive disorder, recurrent, unspecified: Secondary | ICD-10-CM | POA: Diagnosis not present

## 2018-03-30 DIAGNOSIS — F0390 Unspecified dementia without behavioral disturbance: Secondary | ICD-10-CM | POA: Diagnosis not present

## 2018-03-30 DIAGNOSIS — E039 Hypothyroidism, unspecified: Secondary | ICD-10-CM | POA: Diagnosis not present

## 2018-03-31 DIAGNOSIS — T8140XD Infection following a procedure, unspecified, subsequent encounter: Secondary | ICD-10-CM | POA: Diagnosis not present

## 2018-03-31 DIAGNOSIS — M810 Age-related osteoporosis without current pathological fracture: Secondary | ICD-10-CM | POA: Diagnosis not present

## 2018-03-31 DIAGNOSIS — F0390 Unspecified dementia without behavioral disturbance: Secondary | ICD-10-CM | POA: Diagnosis not present

## 2018-03-31 DIAGNOSIS — D649 Anemia, unspecified: Secondary | ICD-10-CM | POA: Diagnosis not present

## 2018-03-31 DIAGNOSIS — F339 Major depressive disorder, recurrent, unspecified: Secondary | ICD-10-CM | POA: Diagnosis not present

## 2018-03-31 DIAGNOSIS — E039 Hypothyroidism, unspecified: Secondary | ICD-10-CM | POA: Diagnosis not present

## 2018-04-02 DIAGNOSIS — D649 Anemia, unspecified: Secondary | ICD-10-CM | POA: Diagnosis not present

## 2018-04-02 DIAGNOSIS — T8140XD Infection following a procedure, unspecified, subsequent encounter: Secondary | ICD-10-CM | POA: Diagnosis not present

## 2018-04-02 DIAGNOSIS — F339 Major depressive disorder, recurrent, unspecified: Secondary | ICD-10-CM | POA: Diagnosis not present

## 2018-04-02 DIAGNOSIS — M810 Age-related osteoporosis without current pathological fracture: Secondary | ICD-10-CM | POA: Diagnosis not present

## 2018-04-02 DIAGNOSIS — E039 Hypothyroidism, unspecified: Secondary | ICD-10-CM | POA: Diagnosis not present

## 2018-04-02 DIAGNOSIS — F0390 Unspecified dementia without behavioral disturbance: Secondary | ICD-10-CM | POA: Diagnosis not present

## 2018-04-03 DIAGNOSIS — F0390 Unspecified dementia without behavioral disturbance: Secondary | ICD-10-CM | POA: Diagnosis not present

## 2018-04-03 DIAGNOSIS — D649 Anemia, unspecified: Secondary | ICD-10-CM | POA: Diagnosis not present

## 2018-04-03 DIAGNOSIS — F339 Major depressive disorder, recurrent, unspecified: Secondary | ICD-10-CM | POA: Diagnosis not present

## 2018-04-03 DIAGNOSIS — M810 Age-related osteoporosis without current pathological fracture: Secondary | ICD-10-CM | POA: Diagnosis not present

## 2018-04-03 DIAGNOSIS — T8140XD Infection following a procedure, unspecified, subsequent encounter: Secondary | ICD-10-CM | POA: Diagnosis not present

## 2018-04-03 DIAGNOSIS — E039 Hypothyroidism, unspecified: Secondary | ICD-10-CM | POA: Diagnosis not present

## 2018-04-06 DIAGNOSIS — M810 Age-related osteoporosis without current pathological fracture: Secondary | ICD-10-CM | POA: Diagnosis not present

## 2018-04-06 DIAGNOSIS — E039 Hypothyroidism, unspecified: Secondary | ICD-10-CM | POA: Diagnosis not present

## 2018-04-06 DIAGNOSIS — D649 Anemia, unspecified: Secondary | ICD-10-CM | POA: Diagnosis not present

## 2018-04-06 DIAGNOSIS — T8140XD Infection following a procedure, unspecified, subsequent encounter: Secondary | ICD-10-CM | POA: Diagnosis not present

## 2018-04-06 DIAGNOSIS — F0390 Unspecified dementia without behavioral disturbance: Secondary | ICD-10-CM | POA: Diagnosis not present

## 2018-04-06 DIAGNOSIS — F339 Major depressive disorder, recurrent, unspecified: Secondary | ICD-10-CM | POA: Diagnosis not present

## 2018-04-10 DIAGNOSIS — F0390 Unspecified dementia without behavioral disturbance: Secondary | ICD-10-CM | POA: Diagnosis not present

## 2018-04-10 DIAGNOSIS — E039 Hypothyroidism, unspecified: Secondary | ICD-10-CM | POA: Diagnosis not present

## 2018-04-10 DIAGNOSIS — M810 Age-related osteoporosis without current pathological fracture: Secondary | ICD-10-CM | POA: Diagnosis not present

## 2018-04-10 DIAGNOSIS — D649 Anemia, unspecified: Secondary | ICD-10-CM | POA: Diagnosis not present

## 2018-04-10 DIAGNOSIS — F339 Major depressive disorder, recurrent, unspecified: Secondary | ICD-10-CM | POA: Diagnosis not present

## 2018-04-10 DIAGNOSIS — T8140XD Infection following a procedure, unspecified, subsequent encounter: Secondary | ICD-10-CM | POA: Diagnosis not present

## 2018-04-11 DIAGNOSIS — E039 Hypothyroidism, unspecified: Secondary | ICD-10-CM | POA: Diagnosis not present

## 2018-04-11 DIAGNOSIS — D649 Anemia, unspecified: Secondary | ICD-10-CM | POA: Diagnosis not present

## 2018-04-11 DIAGNOSIS — F339 Major depressive disorder, recurrent, unspecified: Secondary | ICD-10-CM | POA: Diagnosis not present

## 2018-04-11 DIAGNOSIS — F0390 Unspecified dementia without behavioral disturbance: Secondary | ICD-10-CM | POA: Diagnosis not present

## 2018-04-11 DIAGNOSIS — T8140XD Infection following a procedure, unspecified, subsequent encounter: Secondary | ICD-10-CM | POA: Diagnosis not present

## 2018-04-11 DIAGNOSIS — M810 Age-related osteoporosis without current pathological fracture: Secondary | ICD-10-CM | POA: Diagnosis not present

## 2018-04-13 ENCOUNTER — Other Ambulatory Visit: Payer: Self-pay

## 2018-04-13 DIAGNOSIS — F0390 Unspecified dementia without behavioral disturbance: Secondary | ICD-10-CM | POA: Diagnosis not present

## 2018-04-13 DIAGNOSIS — E039 Hypothyroidism, unspecified: Secondary | ICD-10-CM | POA: Diagnosis not present

## 2018-04-13 DIAGNOSIS — D649 Anemia, unspecified: Secondary | ICD-10-CM | POA: Diagnosis not present

## 2018-04-13 DIAGNOSIS — M810 Age-related osteoporosis without current pathological fracture: Secondary | ICD-10-CM | POA: Diagnosis not present

## 2018-04-13 DIAGNOSIS — F339 Major depressive disorder, recurrent, unspecified: Secondary | ICD-10-CM | POA: Diagnosis not present

## 2018-04-13 DIAGNOSIS — T8140XD Infection following a procedure, unspecified, subsequent encounter: Secondary | ICD-10-CM | POA: Diagnosis not present

## 2018-04-13 NOTE — Telephone Encounter (Signed)
Ronalee Belts, OT with [facility] calling to for patient's husband who has concerns related to oral antibiotics. Patient will be without medications prior to office visit.   Last office visit with Dr Megan Salon n 03-13-2018 states patient is to continue medication for 4 weeks.  Patient's medication may end prior to appointment. I will check with Dr Megan Salon for advise.  Return appointment on 04-27-2018.  Laverle Patter, RN

## 2018-04-13 NOTE — Telephone Encounter (Signed)
Given her husband's concerns she can have refills of her cephalexin and rifampin and continue antibiotics at least until her follow-up appointment with me on 04/27/2017.

## 2018-04-14 NOTE — Telephone Encounter (Signed)
Called Pharmacy to refill Cephalexin and rifampin through 04/27/17.  Husband called and made aware.

## 2018-04-17 DIAGNOSIS — F0390 Unspecified dementia without behavioral disturbance: Secondary | ICD-10-CM | POA: Diagnosis not present

## 2018-04-17 DIAGNOSIS — T8140XD Infection following a procedure, unspecified, subsequent encounter: Secondary | ICD-10-CM | POA: Diagnosis not present

## 2018-04-17 DIAGNOSIS — E039 Hypothyroidism, unspecified: Secondary | ICD-10-CM | POA: Diagnosis not present

## 2018-04-17 DIAGNOSIS — M810 Age-related osteoporosis without current pathological fracture: Secondary | ICD-10-CM | POA: Diagnosis not present

## 2018-04-17 DIAGNOSIS — D649 Anemia, unspecified: Secondary | ICD-10-CM | POA: Diagnosis not present

## 2018-04-17 DIAGNOSIS — F339 Major depressive disorder, recurrent, unspecified: Secondary | ICD-10-CM | POA: Diagnosis not present

## 2018-04-20 DIAGNOSIS — R609 Edema, unspecified: Secondary | ICD-10-CM | POA: Diagnosis not present

## 2018-04-20 DIAGNOSIS — D510 Vitamin B12 deficiency anemia due to intrinsic factor deficiency: Secondary | ICD-10-CM | POA: Diagnosis not present

## 2018-04-20 DIAGNOSIS — Z6825 Body mass index (BMI) 25.0-25.9, adult: Secondary | ICD-10-CM | POA: Diagnosis not present

## 2018-04-21 DIAGNOSIS — T8140XD Infection following a procedure, unspecified, subsequent encounter: Secondary | ICD-10-CM | POA: Diagnosis not present

## 2018-04-21 DIAGNOSIS — E039 Hypothyroidism, unspecified: Secondary | ICD-10-CM | POA: Diagnosis not present

## 2018-04-21 DIAGNOSIS — D649 Anemia, unspecified: Secondary | ICD-10-CM | POA: Diagnosis not present

## 2018-04-21 DIAGNOSIS — F339 Major depressive disorder, recurrent, unspecified: Secondary | ICD-10-CM | POA: Diagnosis not present

## 2018-04-21 DIAGNOSIS — F0390 Unspecified dementia without behavioral disturbance: Secondary | ICD-10-CM | POA: Diagnosis not present

## 2018-04-21 DIAGNOSIS — M810 Age-related osteoporosis without current pathological fracture: Secondary | ICD-10-CM | POA: Diagnosis not present

## 2018-04-27 ENCOUNTER — Encounter: Payer: Self-pay | Admitting: Internal Medicine

## 2018-04-27 ENCOUNTER — Ambulatory Visit (INDEPENDENT_AMBULATORY_CARE_PROVIDER_SITE_OTHER): Payer: Medicare Other | Admitting: Internal Medicine

## 2018-04-27 DIAGNOSIS — B954 Other streptococcus as the cause of diseases classified elsewhere: Secondary | ICD-10-CM | POA: Diagnosis not present

## 2018-04-27 DIAGNOSIS — T8149XD Infection following a procedure, other surgical site, subsequent encounter: Secondary | ICD-10-CM | POA: Diagnosis not present

## 2018-04-27 DIAGNOSIS — Z981 Arthrodesis status: Secondary | ICD-10-CM

## 2018-04-27 DIAGNOSIS — B9561 Methicillin susceptible Staphylococcus aureus infection as the cause of diseases classified elsewhere: Secondary | ICD-10-CM

## 2018-04-27 DIAGNOSIS — T8149XA Infection following a procedure, other surgical site, initial encounter: Secondary | ICD-10-CM

## 2018-04-27 NOTE — Progress Notes (Signed)
[facility] for Infectious Disease  Patient Active Problem List   Diagnosis Date Noted  . S/P lumbar fusion 02/09/2018    Priority: High  . Postoperative wound infection 02/09/2018    Priority: High  . Dementia ([place]) 02/09/2018  . Normocytic anemia 02/09/2018    Patient's Medications  New Prescriptions   No medications on file  Previous Medications   CYANOCOBALAMIN (,VITAMIN B-12,) 1000 MCG/ML INJECTION    Inject into the muscle.   DONEPEZIL (ARICEPT) 10 MG TABLET    Take by mouth.   GABAPENTIN (NEURONTIN) 300 MG CAPSULE       LEVOTHYROXINE (SYNTHROID, LEVOTHROID) 25 MCG TABLET    Take by mouth.   MEMANTINE (NAMENDA) 10 MG TABLET    Take by mouth.   METHYLPREDNISOLONE (MEDROL DOSEPAK) 4 MG TBPK TABLET    See admin instructions. see package   OXYCODONE (OXY IR/ROXICODONE) 5 MG IMMEDIATE RELEASE TABLET       PANTOPRAZOLE (PROTONIX) 40 MG TABLET    Take 40 mg by mouth daily.   RANITIDINE (ZANTAC) 150 MG TABLET    Take by mouth.   SERTRALINE (ZOLOFT) 50 MG TABLET    Take 50 mg by mouth daily.   TRAMADOL (ULTRAM) 50 MG TABLET      Modified Medications   No medications on file  Discontinued Medications   CEPHALEXIN (KEFLEX) 500 MG CAPSULE    Take 1 capsule (500 mg total) by mouth 3 (three) times daily.   RIFAMPIN (RIFADIN) 300 MG CAPSULE    Take 1 capsule (300 mg total) by mouth 2 (two) times daily.    Subjective:         Renee Raymond is in for her routine visit. She underwent lumbar fusion on 01/02/2018.  She did well initially until developing spontaneous drainage from her wound leading to readmission on 01/26/2018.  She underwent incision and drainage with debridement beneath the fascia. Operative cultures grew MSSA and group G strep she was discharged 4 days later on IV cefazolin.    I added oral rifampin on 02/09/2018.  She completed 46 days of IV antibiotic therapy on 03/13/2017.  She continued on oral cephalexin and rifampin for 4 more weeks.  She completed  therapy about 1 week ago.  She is feeling dramatically better.  Her husband feels like she is not nearly as confused as she was.  She is having only minimal back pain and is not requiring any pain medication.  She is resuming most of her usual activities.  Review of Systems: Review of Systems  Constitutional: Positive for malaise/fatigue. Negative for chills, diaphoresis and fever.  Genitourinary:       Her back pain is much improvement.  Musculoskeletal: Positive for back pain.  Skin: Negative for rash.  Neurological: Positive for weakness.    No past medical history on file.  Social History   Tobacco Use  . Smoking status: Never Smoker  . Smokeless tobacco: Never Used  Substance Use Topics  . Alcohol use: Not on file  . Drug use: Not on file    No family history on file.  Allergies  Allergen Reactions  . Codeine Other (See Comments)    Objective: Vitals:   04/27/18 1445  BP: 106/64  Pulse: 60  Temp: 98 F (36.7 C)  Weight: 116 lb (52.6 kg)  Height: 4\' 9"  (1.448 m)   Body mass index is 25.1 kg/m.  Physical Exam Constitutional:      Comments:  She is much more alert and interactive.  Musculoskeletal:     Comments: Her lumbar incision has healed nicely.  Skin:    Findings: No rash.  Neurological:     Mental Status: She is alert.     Comments: She can stand without difficulty.  She is not requiring a walker or cane for assistance.      Problem List Items Addressed This Visit      High   Postoperative wound infection    She is doing much better and I am very hopeful that her infection has been cured.  She will stay off of antibiotics and follow-up here in 6 weeks.          Michel Bickers, MD [facility] for [facility] [phone] pager   [phone] cell 04/27/2018, 2:59 PM

## 2018-04-27 NOTE — Assessment & Plan Note (Signed)
She is doing much better and I am very hopeful that her infection has been cured.  She will stay off of antibiotics and follow-up here in 6 weeks.

## 2018-05-10 DIAGNOSIS — Z9889 Other specified postprocedural states: Secondary | ICD-10-CM | POA: Diagnosis not present

## 2018-05-10 DIAGNOSIS — M4316 Spondylolisthesis, lumbar region: Secondary | ICD-10-CM | POA: Diagnosis not present

## 2018-05-16 DIAGNOSIS — G301 Alzheimer's disease with late onset: Secondary | ICD-10-CM | POA: Diagnosis not present

## 2018-05-16 DIAGNOSIS — F0280 Dementia in other diseases classified elsewhere without behavioral disturbance: Secondary | ICD-10-CM | POA: Diagnosis not present

## 2018-05-16 DIAGNOSIS — F0781 Postconcussional syndrome: Secondary | ICD-10-CM | POA: Diagnosis not present

## 2018-05-26 DIAGNOSIS — Z79899 Other long term (current) drug therapy: Secondary | ICD-10-CM | POA: Diagnosis not present

## 2018-05-26 DIAGNOSIS — Z9889 Other specified postprocedural states: Secondary | ICD-10-CM | POA: Diagnosis not present

## 2018-06-12 ENCOUNTER — Ambulatory Visit: Payer: Medicare Other | Admitting: Internal Medicine

## 2018-08-09 DIAGNOSIS — M4316 Spondylolisthesis, lumbar region: Secondary | ICD-10-CM | POA: Diagnosis not present

## 2018-08-09 DIAGNOSIS — Z9889 Other specified postprocedural states: Secondary | ICD-10-CM | POA: Diagnosis not present

## 2018-10-03 DIAGNOSIS — Z9889 Other specified postprocedural states: Secondary | ICD-10-CM | POA: Diagnosis not present

## 2018-10-03 DIAGNOSIS — M4316 Spondylolisthesis, lumbar region: Secondary | ICD-10-CM | POA: Diagnosis not present

## 2018-10-25 DIAGNOSIS — F0390 Unspecified dementia without behavioral disturbance: Secondary | ICD-10-CM | POA: Diagnosis not present

## 2018-10-25 DIAGNOSIS — Z03818 Encounter for observation for suspected exposure to other biological agents ruled out: Secondary | ICD-10-CM | POA: Diagnosis not present

## 2018-10-25 DIAGNOSIS — N289 Disorder of kidney and ureter, unspecified: Secondary | ICD-10-CM | POA: Diagnosis not present

## 2018-10-25 DIAGNOSIS — R05 Cough: Secondary | ICD-10-CM | POA: Diagnosis not present

## 2018-10-25 DIAGNOSIS — F418 Other specified anxiety disorders: Secondary | ICD-10-CM | POA: Diagnosis not present

## 2018-10-25 DIAGNOSIS — E860 Dehydration: Secondary | ICD-10-CM | POA: Diagnosis not present

## 2018-10-25 DIAGNOSIS — R0600 Dyspnea, unspecified: Secondary | ICD-10-CM | POA: Diagnosis not present

## 2018-10-25 DIAGNOSIS — R0602 Shortness of breath: Secondary | ICD-10-CM | POA: Diagnosis not present

## 2018-10-27 DIAGNOSIS — N189 Chronic kidney disease, unspecified: Secondary | ICD-10-CM | POA: Diagnosis not present

## 2018-10-27 DIAGNOSIS — G3109 Other frontotemporal dementia: Secondary | ICD-10-CM | POA: Diagnosis not present

## 2018-10-27 DIAGNOSIS — J449 Chronic obstructive pulmonary disease, unspecified: Secondary | ICD-10-CM | POA: Diagnosis not present

## 2018-10-27 DIAGNOSIS — F419 Anxiety disorder, unspecified: Secondary | ICD-10-CM | POA: Diagnosis not present

## 2018-10-27 DIAGNOSIS — F329 Major depressive disorder, single episode, unspecified: Secondary | ICD-10-CM | POA: Diagnosis not present

## 2018-10-27 DIAGNOSIS — Z8711 Personal history of peptic ulcer disease: Secondary | ICD-10-CM | POA: Diagnosis not present

## 2018-10-27 DIAGNOSIS — E2740 Unspecified adrenocortical insufficiency: Secondary | ICD-10-CM | POA: Diagnosis not present

## 2018-10-27 DIAGNOSIS — E039 Hypothyroidism, unspecified: Secondary | ICD-10-CM | POA: Diagnosis not present

## 2018-10-27 DIAGNOSIS — F0280 Dementia in other diseases classified elsewhere without behavioral disturbance: Secondary | ICD-10-CM | POA: Diagnosis not present

## 2018-10-28 DIAGNOSIS — E2740 Unspecified adrenocortical insufficiency: Secondary | ICD-10-CM | POA: Diagnosis not present

## 2018-10-28 DIAGNOSIS — E039 Hypothyroidism, unspecified: Secondary | ICD-10-CM | POA: Diagnosis not present

## 2018-10-28 DIAGNOSIS — J449 Chronic obstructive pulmonary disease, unspecified: Secondary | ICD-10-CM | POA: Diagnosis not present

## 2018-10-28 DIAGNOSIS — G3109 Other frontotemporal dementia: Secondary | ICD-10-CM | POA: Diagnosis not present

## 2018-10-28 DIAGNOSIS — F0280 Dementia in other diseases classified elsewhere without behavioral disturbance: Secondary | ICD-10-CM | POA: Diagnosis not present

## 2018-10-28 DIAGNOSIS — N189 Chronic kidney disease, unspecified: Secondary | ICD-10-CM | POA: Diagnosis not present

## 2018-10-30 DIAGNOSIS — E2740 Unspecified adrenocortical insufficiency: Secondary | ICD-10-CM | POA: Diagnosis not present

## 2018-10-30 DIAGNOSIS — E039 Hypothyroidism, unspecified: Secondary | ICD-10-CM | POA: Diagnosis not present

## 2018-10-30 DIAGNOSIS — N189 Chronic kidney disease, unspecified: Secondary | ICD-10-CM | POA: Diagnosis not present

## 2018-10-30 DIAGNOSIS — F0280 Dementia in other diseases classified elsewhere without behavioral disturbance: Secondary | ICD-10-CM | POA: Diagnosis not present

## 2018-10-30 DIAGNOSIS — G3109 Other frontotemporal dementia: Secondary | ICD-10-CM | POA: Diagnosis not present

## 2018-10-30 DIAGNOSIS — J449 Chronic obstructive pulmonary disease, unspecified: Secondary | ICD-10-CM | POA: Diagnosis not present

## 2018-10-31 DIAGNOSIS — E2740 Unspecified adrenocortical insufficiency: Secondary | ICD-10-CM | POA: Diagnosis not present

## 2018-10-31 DIAGNOSIS — E039 Hypothyroidism, unspecified: Secondary | ICD-10-CM | POA: Diagnosis not present

## 2018-10-31 DIAGNOSIS — N189 Chronic kidney disease, unspecified: Secondary | ICD-10-CM | POA: Diagnosis not present

## 2018-10-31 DIAGNOSIS — G3109 Other frontotemporal dementia: Secondary | ICD-10-CM | POA: Diagnosis not present

## 2018-10-31 DIAGNOSIS — J449 Chronic obstructive pulmonary disease, unspecified: Secondary | ICD-10-CM | POA: Diagnosis not present

## 2018-10-31 DIAGNOSIS — F0280 Dementia in other diseases classified elsewhere without behavioral disturbance: Secondary | ICD-10-CM | POA: Diagnosis not present

## 2018-11-01 DIAGNOSIS — F0280 Dementia in other diseases classified elsewhere without behavioral disturbance: Secondary | ICD-10-CM | POA: Diagnosis not present

## 2018-11-01 DIAGNOSIS — J449 Chronic obstructive pulmonary disease, unspecified: Secondary | ICD-10-CM | POA: Diagnosis not present

## 2018-11-01 DIAGNOSIS — E039 Hypothyroidism, unspecified: Secondary | ICD-10-CM | POA: Diagnosis not present

## 2018-11-01 DIAGNOSIS — N189 Chronic kidney disease, unspecified: Secondary | ICD-10-CM | POA: Diagnosis not present

## 2018-11-01 DIAGNOSIS — G3109 Other frontotemporal dementia: Secondary | ICD-10-CM | POA: Diagnosis not present

## 2018-11-01 DIAGNOSIS — E2740 Unspecified adrenocortical insufficiency: Secondary | ICD-10-CM | POA: Diagnosis not present

## 2018-11-02 DIAGNOSIS — N189 Chronic kidney disease, unspecified: Secondary | ICD-10-CM | POA: Diagnosis not present

## 2018-11-02 DIAGNOSIS — F0280 Dementia in other diseases classified elsewhere without behavioral disturbance: Secondary | ICD-10-CM | POA: Diagnosis not present

## 2018-11-02 DIAGNOSIS — J449 Chronic obstructive pulmonary disease, unspecified: Secondary | ICD-10-CM | POA: Diagnosis not present

## 2018-11-02 DIAGNOSIS — G3109 Other frontotemporal dementia: Secondary | ICD-10-CM | POA: Diagnosis not present

## 2018-11-02 DIAGNOSIS — E2740 Unspecified adrenocortical insufficiency: Secondary | ICD-10-CM | POA: Diagnosis not present

## 2018-11-02 DIAGNOSIS — E039 Hypothyroidism, unspecified: Secondary | ICD-10-CM | POA: Diagnosis not present

## 2018-11-03 DIAGNOSIS — J449 Chronic obstructive pulmonary disease, unspecified: Secondary | ICD-10-CM | POA: Diagnosis not present

## 2018-11-03 DIAGNOSIS — N189 Chronic kidney disease, unspecified: Secondary | ICD-10-CM | POA: Diagnosis not present

## 2018-11-03 DIAGNOSIS — E2740 Unspecified adrenocortical insufficiency: Secondary | ICD-10-CM | POA: Diagnosis not present

## 2018-11-03 DIAGNOSIS — E039 Hypothyroidism, unspecified: Secondary | ICD-10-CM | POA: Diagnosis not present

## 2018-11-03 DIAGNOSIS — F0280 Dementia in other diseases classified elsewhere without behavioral disturbance: Secondary | ICD-10-CM | POA: Diagnosis not present

## 2018-11-03 DIAGNOSIS — G3109 Other frontotemporal dementia: Secondary | ICD-10-CM | POA: Diagnosis not present

## 2018-11-06 DIAGNOSIS — N189 Chronic kidney disease, unspecified: Secondary | ICD-10-CM | POA: Diagnosis not present

## 2018-11-06 DIAGNOSIS — G3109 Other frontotemporal dementia: Secondary | ICD-10-CM | POA: Diagnosis not present

## 2018-11-06 DIAGNOSIS — F0280 Dementia in other diseases classified elsewhere without behavioral disturbance: Secondary | ICD-10-CM | POA: Diagnosis not present

## 2018-11-06 DIAGNOSIS — J449 Chronic obstructive pulmonary disease, unspecified: Secondary | ICD-10-CM | POA: Diagnosis not present

## 2018-11-06 DIAGNOSIS — E2740 Unspecified adrenocortical insufficiency: Secondary | ICD-10-CM | POA: Diagnosis not present

## 2018-11-06 DIAGNOSIS — E039 Hypothyroidism, unspecified: Secondary | ICD-10-CM | POA: Diagnosis not present

## 2018-11-08 DIAGNOSIS — N189 Chronic kidney disease, unspecified: Secondary | ICD-10-CM | POA: Diagnosis not present

## 2018-11-08 DIAGNOSIS — F0280 Dementia in other diseases classified elsewhere without behavioral disturbance: Secondary | ICD-10-CM | POA: Diagnosis not present

## 2018-11-08 DIAGNOSIS — E039 Hypothyroidism, unspecified: Secondary | ICD-10-CM | POA: Diagnosis not present

## 2018-11-08 DIAGNOSIS — J449 Chronic obstructive pulmonary disease, unspecified: Secondary | ICD-10-CM | POA: Diagnosis not present

## 2018-11-08 DIAGNOSIS — G3109 Other frontotemporal dementia: Secondary | ICD-10-CM | POA: Diagnosis not present

## 2018-11-08 DIAGNOSIS — E2740 Unspecified adrenocortical insufficiency: Secondary | ICD-10-CM | POA: Diagnosis not present

## 2018-11-09 DIAGNOSIS — E2740 Unspecified adrenocortical insufficiency: Secondary | ICD-10-CM | POA: Diagnosis not present

## 2018-11-09 DIAGNOSIS — G3109 Other frontotemporal dementia: Secondary | ICD-10-CM | POA: Diagnosis not present

## 2018-11-09 DIAGNOSIS — J449 Chronic obstructive pulmonary disease, unspecified: Secondary | ICD-10-CM | POA: Diagnosis not present

## 2018-11-09 DIAGNOSIS — N189 Chronic kidney disease, unspecified: Secondary | ICD-10-CM | POA: Diagnosis not present

## 2018-11-09 DIAGNOSIS — F0280 Dementia in other diseases classified elsewhere without behavioral disturbance: Secondary | ICD-10-CM | POA: Diagnosis not present

## 2018-11-09 DIAGNOSIS — E039 Hypothyroidism, unspecified: Secondary | ICD-10-CM | POA: Diagnosis not present

## 2018-11-11 DIAGNOSIS — E039 Hypothyroidism, unspecified: Secondary | ICD-10-CM | POA: Diagnosis not present

## 2018-11-11 DIAGNOSIS — Z8711 Personal history of peptic ulcer disease: Secondary | ICD-10-CM | POA: Diagnosis not present

## 2018-11-11 DIAGNOSIS — F329 Major depressive disorder, single episode, unspecified: Secondary | ICD-10-CM | POA: Diagnosis not present

## 2018-11-11 DIAGNOSIS — G3109 Other frontotemporal dementia: Secondary | ICD-10-CM | POA: Diagnosis not present

## 2018-11-11 DIAGNOSIS — E2740 Unspecified adrenocortical insufficiency: Secondary | ICD-10-CM | POA: Diagnosis not present

## 2018-11-11 DIAGNOSIS — F419 Anxiety disorder, unspecified: Secondary | ICD-10-CM | POA: Diagnosis not present

## 2018-11-11 DIAGNOSIS — N189 Chronic kidney disease, unspecified: Secondary | ICD-10-CM | POA: Diagnosis not present

## 2018-11-11 DIAGNOSIS — F0280 Dementia in other diseases classified elsewhere without behavioral disturbance: Secondary | ICD-10-CM | POA: Diagnosis not present

## 2018-11-11 DIAGNOSIS — J449 Chronic obstructive pulmonary disease, unspecified: Secondary | ICD-10-CM | POA: Diagnosis not present

## 2018-11-15 DIAGNOSIS — E039 Hypothyroidism, unspecified: Secondary | ICD-10-CM | POA: Diagnosis not present

## 2018-11-15 DIAGNOSIS — E2740 Unspecified adrenocortical insufficiency: Secondary | ICD-10-CM | POA: Diagnosis not present

## 2018-11-15 DIAGNOSIS — N189 Chronic kidney disease, unspecified: Secondary | ICD-10-CM | POA: Diagnosis not present

## 2018-11-15 DIAGNOSIS — G3109 Other frontotemporal dementia: Secondary | ICD-10-CM | POA: Diagnosis not present

## 2018-11-15 DIAGNOSIS — F0280 Dementia in other diseases classified elsewhere without behavioral disturbance: Secondary | ICD-10-CM | POA: Diagnosis not present

## 2018-11-15 DIAGNOSIS — J449 Chronic obstructive pulmonary disease, unspecified: Secondary | ICD-10-CM | POA: Diagnosis not present

## 2018-11-22 DIAGNOSIS — E2740 Unspecified adrenocortical insufficiency: Secondary | ICD-10-CM | POA: Diagnosis not present

## 2018-11-22 DIAGNOSIS — J449 Chronic obstructive pulmonary disease, unspecified: Secondary | ICD-10-CM | POA: Diagnosis not present

## 2018-11-22 DIAGNOSIS — F0280 Dementia in other diseases classified elsewhere without behavioral disturbance: Secondary | ICD-10-CM | POA: Diagnosis not present

## 2018-11-22 DIAGNOSIS — N189 Chronic kidney disease, unspecified: Secondary | ICD-10-CM | POA: Diagnosis not present

## 2018-11-22 DIAGNOSIS — G3109 Other frontotemporal dementia: Secondary | ICD-10-CM | POA: Diagnosis not present

## 2018-11-22 DIAGNOSIS — E039 Hypothyroidism, unspecified: Secondary | ICD-10-CM | POA: Diagnosis not present

## 2018-11-28 DIAGNOSIS — N189 Chronic kidney disease, unspecified: Secondary | ICD-10-CM | POA: Diagnosis not present

## 2018-11-28 DIAGNOSIS — J449 Chronic obstructive pulmonary disease, unspecified: Secondary | ICD-10-CM | POA: Diagnosis not present

## 2018-11-28 DIAGNOSIS — E039 Hypothyroidism, unspecified: Secondary | ICD-10-CM | POA: Diagnosis not present

## 2018-11-28 DIAGNOSIS — G3109 Other frontotemporal dementia: Secondary | ICD-10-CM | POA: Diagnosis not present

## 2018-11-28 DIAGNOSIS — E2740 Unspecified adrenocortical insufficiency: Secondary | ICD-10-CM | POA: Diagnosis not present

## 2018-11-28 DIAGNOSIS — F0280 Dementia in other diseases classified elsewhere without behavioral disturbance: Secondary | ICD-10-CM | POA: Diagnosis not present

## 2018-12-07 DIAGNOSIS — N189 Chronic kidney disease, unspecified: Secondary | ICD-10-CM | POA: Diagnosis not present

## 2018-12-07 DIAGNOSIS — J449 Chronic obstructive pulmonary disease, unspecified: Secondary | ICD-10-CM | POA: Diagnosis not present

## 2018-12-07 DIAGNOSIS — E2740 Unspecified adrenocortical insufficiency: Secondary | ICD-10-CM | POA: Diagnosis not present

## 2018-12-07 DIAGNOSIS — E039 Hypothyroidism, unspecified: Secondary | ICD-10-CM | POA: Diagnosis not present

## 2018-12-07 DIAGNOSIS — G3109 Other frontotemporal dementia: Secondary | ICD-10-CM | POA: Diagnosis not present

## 2018-12-07 DIAGNOSIS — F0280 Dementia in other diseases classified elsewhere without behavioral disturbance: Secondary | ICD-10-CM | POA: Diagnosis not present

## 2018-12-08 DIAGNOSIS — S300XXA Contusion of lower back and pelvis, initial encounter: Secondary | ICD-10-CM | POA: Diagnosis not present

## 2018-12-08 DIAGNOSIS — Z6830 Body mass index (BMI) 30.0-30.9, adult: Secondary | ICD-10-CM | POA: Diagnosis not present

## 2018-12-12 DIAGNOSIS — E2740 Unspecified adrenocortical insufficiency: Secondary | ICD-10-CM | POA: Diagnosis not present

## 2018-12-12 DIAGNOSIS — E039 Hypothyroidism, unspecified: Secondary | ICD-10-CM | POA: Diagnosis not present

## 2018-12-12 DIAGNOSIS — J449 Chronic obstructive pulmonary disease, unspecified: Secondary | ICD-10-CM | POA: Diagnosis not present

## 2018-12-12 DIAGNOSIS — Z8711 Personal history of peptic ulcer disease: Secondary | ICD-10-CM | POA: Diagnosis not present

## 2018-12-12 DIAGNOSIS — F0280 Dementia in other diseases classified elsewhere without behavioral disturbance: Secondary | ICD-10-CM | POA: Diagnosis not present

## 2018-12-12 DIAGNOSIS — N189 Chronic kidney disease, unspecified: Secondary | ICD-10-CM | POA: Diagnosis not present

## 2018-12-12 DIAGNOSIS — F419 Anxiety disorder, unspecified: Secondary | ICD-10-CM | POA: Diagnosis not present

## 2018-12-12 DIAGNOSIS — G3109 Other frontotemporal dementia: Secondary | ICD-10-CM | POA: Diagnosis not present

## 2018-12-12 DIAGNOSIS — F329 Major depressive disorder, single episode, unspecified: Secondary | ICD-10-CM | POA: Diagnosis not present

## 2018-12-13 DIAGNOSIS — F0280 Dementia in other diseases classified elsewhere without behavioral disturbance: Secondary | ICD-10-CM | POA: Diagnosis not present

## 2018-12-13 DIAGNOSIS — E039 Hypothyroidism, unspecified: Secondary | ICD-10-CM | POA: Diagnosis not present

## 2018-12-13 DIAGNOSIS — J449 Chronic obstructive pulmonary disease, unspecified: Secondary | ICD-10-CM | POA: Diagnosis not present

## 2018-12-13 DIAGNOSIS — G3109 Other frontotemporal dementia: Secondary | ICD-10-CM | POA: Diagnosis not present

## 2018-12-13 DIAGNOSIS — E2740 Unspecified adrenocortical insufficiency: Secondary | ICD-10-CM | POA: Diagnosis not present

## 2018-12-13 DIAGNOSIS — N189 Chronic kidney disease, unspecified: Secondary | ICD-10-CM | POA: Diagnosis not present

## 2019-01-02 DIAGNOSIS — N189 Chronic kidney disease, unspecified: Secondary | ICD-10-CM | POA: Diagnosis not present

## 2019-01-02 DIAGNOSIS — E2740 Unspecified adrenocortical insufficiency: Secondary | ICD-10-CM | POA: Diagnosis not present

## 2019-01-02 DIAGNOSIS — G3109 Other frontotemporal dementia: Secondary | ICD-10-CM | POA: Diagnosis not present

## 2019-01-02 DIAGNOSIS — J449 Chronic obstructive pulmonary disease, unspecified: Secondary | ICD-10-CM | POA: Diagnosis not present

## 2019-01-02 DIAGNOSIS — E039 Hypothyroidism, unspecified: Secondary | ICD-10-CM | POA: Diagnosis not present

## 2019-01-02 DIAGNOSIS — F0280 Dementia in other diseases classified elsewhere without behavioral disturbance: Secondary | ICD-10-CM | POA: Diagnosis not present

## 2019-01-11 DIAGNOSIS — J449 Chronic obstructive pulmonary disease, unspecified: Secondary | ICD-10-CM | POA: Diagnosis not present

## 2019-01-11 DIAGNOSIS — E039 Hypothyroidism, unspecified: Secondary | ICD-10-CM | POA: Diagnosis not present

## 2019-01-11 DIAGNOSIS — E2740 Unspecified adrenocortical insufficiency: Secondary | ICD-10-CM | POA: Diagnosis not present

## 2019-01-11 DIAGNOSIS — F419 Anxiety disorder, unspecified: Secondary | ICD-10-CM | POA: Diagnosis not present

## 2019-01-11 DIAGNOSIS — N189 Chronic kidney disease, unspecified: Secondary | ICD-10-CM | POA: Diagnosis not present

## 2019-01-11 DIAGNOSIS — Z8711 Personal history of peptic ulcer disease: Secondary | ICD-10-CM | POA: Diagnosis not present

## 2019-01-11 DIAGNOSIS — F329 Major depressive disorder, single episode, unspecified: Secondary | ICD-10-CM | POA: Diagnosis not present

## 2019-01-11 DIAGNOSIS — G3109 Other frontotemporal dementia: Secondary | ICD-10-CM | POA: Diagnosis not present

## 2019-01-11 DIAGNOSIS — F0280 Dementia in other diseases classified elsewhere without behavioral disturbance: Secondary | ICD-10-CM | POA: Diagnosis not present

## 2019-01-13 DIAGNOSIS — J449 Chronic obstructive pulmonary disease, unspecified: Secondary | ICD-10-CM | POA: Diagnosis not present

## 2019-01-13 DIAGNOSIS — N189 Chronic kidney disease, unspecified: Secondary | ICD-10-CM | POA: Diagnosis not present

## 2019-01-13 DIAGNOSIS — F0280 Dementia in other diseases classified elsewhere without behavioral disturbance: Secondary | ICD-10-CM | POA: Diagnosis not present

## 2019-01-13 DIAGNOSIS — E2740 Unspecified adrenocortical insufficiency: Secondary | ICD-10-CM | POA: Diagnosis not present

## 2019-01-13 DIAGNOSIS — G3109 Other frontotemporal dementia: Secondary | ICD-10-CM | POA: Diagnosis not present

## 2019-01-13 DIAGNOSIS — E039 Hypothyroidism, unspecified: Secondary | ICD-10-CM | POA: Diagnosis not present

## 2019-01-15 DIAGNOSIS — G3109 Other frontotemporal dementia: Secondary | ICD-10-CM | POA: Diagnosis not present

## 2019-01-15 DIAGNOSIS — E2740 Unspecified adrenocortical insufficiency: Secondary | ICD-10-CM | POA: Diagnosis not present

## 2019-01-15 DIAGNOSIS — E039 Hypothyroidism, unspecified: Secondary | ICD-10-CM | POA: Diagnosis not present

## 2019-01-15 DIAGNOSIS — N189 Chronic kidney disease, unspecified: Secondary | ICD-10-CM | POA: Diagnosis not present

## 2019-01-15 DIAGNOSIS — F0280 Dementia in other diseases classified elsewhere without behavioral disturbance: Secondary | ICD-10-CM | POA: Diagnosis not present

## 2019-01-15 DIAGNOSIS — J449 Chronic obstructive pulmonary disease, unspecified: Secondary | ICD-10-CM | POA: Diagnosis not present

## 2019-01-16 DIAGNOSIS — F0280 Dementia in other diseases classified elsewhere without behavioral disturbance: Secondary | ICD-10-CM | POA: Diagnosis not present

## 2019-01-16 DIAGNOSIS — J449 Chronic obstructive pulmonary disease, unspecified: Secondary | ICD-10-CM | POA: Diagnosis not present

## 2019-01-16 DIAGNOSIS — E2740 Unspecified adrenocortical insufficiency: Secondary | ICD-10-CM | POA: Diagnosis not present

## 2019-01-16 DIAGNOSIS — G3109 Other frontotemporal dementia: Secondary | ICD-10-CM | POA: Diagnosis not present

## 2019-01-16 DIAGNOSIS — E039 Hypothyroidism, unspecified: Secondary | ICD-10-CM | POA: Diagnosis not present

## 2019-01-16 DIAGNOSIS — N189 Chronic kidney disease, unspecified: Secondary | ICD-10-CM | POA: Diagnosis not present

## 2019-01-18 DIAGNOSIS — N189 Chronic kidney disease, unspecified: Secondary | ICD-10-CM | POA: Diagnosis not present

## 2019-01-18 DIAGNOSIS — E2740 Unspecified adrenocortical insufficiency: Secondary | ICD-10-CM | POA: Diagnosis not present

## 2019-01-18 DIAGNOSIS — E039 Hypothyroidism, unspecified: Secondary | ICD-10-CM | POA: Diagnosis not present

## 2019-01-18 DIAGNOSIS — F0280 Dementia in other diseases classified elsewhere without behavioral disturbance: Secondary | ICD-10-CM | POA: Diagnosis not present

## 2019-01-18 DIAGNOSIS — J449 Chronic obstructive pulmonary disease, unspecified: Secondary | ICD-10-CM | POA: Diagnosis not present

## 2019-01-18 DIAGNOSIS — G3109 Other frontotemporal dementia: Secondary | ICD-10-CM | POA: Diagnosis not present

## 2019-02-08 DIAGNOSIS — M4315 Spondylolisthesis, thoracolumbar region: Secondary | ICD-10-CM | POA: Diagnosis not present

## 2019-02-08 DIAGNOSIS — Z9889 Other specified postprocedural states: Secondary | ICD-10-CM | POA: Diagnosis not present

## 2019-02-19 DIAGNOSIS — F0630 Mood disorder due to known physiological condition, unspecified: Secondary | ICD-10-CM | POA: Diagnosis not present

## 2019-02-19 DIAGNOSIS — Z6828 Body mass index (BMI) 28.0-28.9, adult: Secondary | ICD-10-CM | POA: Diagnosis not present

## 2019-02-19 DIAGNOSIS — F0390 Unspecified dementia without behavioral disturbance: Secondary | ICD-10-CM | POA: Diagnosis not present

## 2019-02-19 DIAGNOSIS — Z1331 Encounter for screening for depression: Secondary | ICD-10-CM | POA: Diagnosis not present

## 2019-02-27 DIAGNOSIS — Z111 Encounter for screening for respiratory tuberculosis: Secondary | ICD-10-CM | POA: Diagnosis not present

## 2019-02-27 DIAGNOSIS — Z20828 Contact with and (suspected) exposure to other viral communicable diseases: Secondary | ICD-10-CM | POA: Diagnosis not present

## 2019-03-02 DIAGNOSIS — G9349 Other encephalopathy: Secondary | ICD-10-CM | POA: Diagnosis not present

## 2019-03-02 DIAGNOSIS — N3000 Acute cystitis without hematuria: Secondary | ICD-10-CM | POA: Diagnosis not present

## 2019-03-02 DIAGNOSIS — F0391 Unspecified dementia with behavioral disturbance: Secondary | ICD-10-CM | POA: Diagnosis not present

## 2019-03-02 DIAGNOSIS — R451 Restlessness and agitation: Secondary | ICD-10-CM | POA: Diagnosis not present

## 2019-03-02 DIAGNOSIS — Z1339 Encounter for screening examination for other mental health and behavioral disorders: Secondary | ICD-10-CM | POA: Diagnosis not present

## 2019-03-02 DIAGNOSIS — M1990 Unspecified osteoarthritis, unspecified site: Secondary | ICD-10-CM | POA: Diagnosis not present

## 2019-03-02 DIAGNOSIS — K219 Gastro-esophageal reflux disease without esophagitis: Secondary | ICD-10-CM | POA: Diagnosis not present

## 2019-03-02 DIAGNOSIS — Z20828 Contact with and (suspected) exposure to other viral communicable diseases: Secondary | ICD-10-CM | POA: Diagnosis not present

## 2019-03-03 DIAGNOSIS — G9349 Other encephalopathy: Secondary | ICD-10-CM | POA: Diagnosis present

## 2019-03-03 DIAGNOSIS — F0391 Unspecified dementia with behavioral disturbance: Secondary | ICD-10-CM | POA: Diagnosis present

## 2019-03-03 DIAGNOSIS — K219 Gastro-esophageal reflux disease without esophagitis: Secondary | ICD-10-CM | POA: Diagnosis present

## 2019-03-03 DIAGNOSIS — Z79899 Other long term (current) drug therapy: Secondary | ICD-10-CM | POA: Diagnosis not present

## 2019-03-03 DIAGNOSIS — S0990XA Unspecified injury of head, initial encounter: Secondary | ICD-10-CM | POA: Diagnosis not present

## 2019-03-03 DIAGNOSIS — R451 Restlessness and agitation: Secondary | ICD-10-CM | POA: Diagnosis present

## 2019-03-03 DIAGNOSIS — N390 Urinary tract infection, site not specified: Secondary | ICD-10-CM | POA: Diagnosis not present

## 2019-03-03 DIAGNOSIS — G8929 Other chronic pain: Secondary | ICD-10-CM | POA: Diagnosis not present

## 2019-03-03 DIAGNOSIS — B9620 Unspecified Escherichia coli [E. coli] as the cause of diseases classified elsewhere: Secondary | ICD-10-CM | POA: Diagnosis present

## 2019-03-03 DIAGNOSIS — M545 Low back pain: Secondary | ICD-10-CM | POA: Diagnosis not present

## 2019-03-03 DIAGNOSIS — R4587 Impulsiveness: Secondary | ICD-10-CM | POA: Diagnosis present

## 2019-03-03 DIAGNOSIS — Z1339 Encounter for screening examination for other mental health and behavioral disorders: Secondary | ICD-10-CM | POA: Diagnosis not present

## 2019-03-03 DIAGNOSIS — M1990 Unspecified osteoarthritis, unspecified site: Secondary | ICD-10-CM | POA: Diagnosis present

## 2019-03-03 DIAGNOSIS — F0781 Postconcussional syndrome: Secondary | ICD-10-CM | POA: Diagnosis not present

## 2019-03-03 DIAGNOSIS — F0281 Dementia in other diseases classified elsewhere with behavioral disturbance: Secondary | ICD-10-CM | POA: Diagnosis not present

## 2019-03-03 DIAGNOSIS — N3000 Acute cystitis without hematuria: Secondary | ICD-10-CM | POA: Diagnosis present

## 2019-03-16 DIAGNOSIS — F0390 Unspecified dementia without behavioral disturbance: Secondary | ICD-10-CM | POA: Diagnosis not present

## 2019-03-16 DIAGNOSIS — F0391 Unspecified dementia with behavioral disturbance: Secondary | ICD-10-CM | POA: Diagnosis not present

## 2019-03-16 DIAGNOSIS — R7989 Other specified abnormal findings of blood chemistry: Secondary | ICD-10-CM | POA: Diagnosis not present

## 2019-03-17 DIAGNOSIS — F0391 Unspecified dementia with behavioral disturbance: Secondary | ICD-10-CM | POA: Diagnosis not present

## 2019-03-18 DIAGNOSIS — F0390 Unspecified dementia without behavioral disturbance: Secondary | ICD-10-CM | POA: Diagnosis not present

## 2019-03-19 DIAGNOSIS — M1990 Unspecified osteoarthritis, unspecified site: Secondary | ICD-10-CM | POA: Diagnosis not present

## 2019-03-19 DIAGNOSIS — K219 Gastro-esophageal reflux disease without esophagitis: Secondary | ICD-10-CM | POA: Diagnosis not present

## 2019-03-19 DIAGNOSIS — F0781 Postconcussional syndrome: Secondary | ICD-10-CM | POA: Diagnosis not present

## 2019-04-23 DIAGNOSIS — K219 Gastro-esophageal reflux disease without esophagitis: Secondary | ICD-10-CM | POA: Diagnosis not present

## 2019-04-23 DIAGNOSIS — G9340 Encephalopathy, unspecified: Secondary | ICD-10-CM | POA: Diagnosis not present

## 2019-04-23 DIAGNOSIS — F064 Anxiety disorder due to known physiological condition: Secondary | ICD-10-CM | POA: Diagnosis not present

## 2019-04-23 DIAGNOSIS — M1991 Primary osteoarthritis, unspecified site: Secondary | ICD-10-CM | POA: Diagnosis not present

## 2019-05-02 DIAGNOSIS — Z23 Encounter for immunization: Secondary | ICD-10-CM | POA: Diagnosis not present

## 2019-05-04 DIAGNOSIS — R4181 Age-related cognitive decline: Secondary | ICD-10-CM | POA: Diagnosis not present

## 2019-05-04 DIAGNOSIS — Z9114 Patient's other noncompliance with medication regimen: Secondary | ICD-10-CM | POA: Diagnosis not present

## 2019-05-04 DIAGNOSIS — F09 Unspecified mental disorder due to known physiological condition: Secondary | ICD-10-CM | POA: Diagnosis not present

## 2019-05-07 DIAGNOSIS — F99 Mental disorder, not otherwise specified: Secondary | ICD-10-CM | POA: Diagnosis not present

## 2019-05-07 DIAGNOSIS — F0390 Unspecified dementia without behavioral disturbance: Secondary | ICD-10-CM | POA: Diagnosis not present

## 2019-05-08 DIAGNOSIS — G300 Alzheimer's disease with early onset: Secondary | ICD-10-CM | POA: Diagnosis not present

## 2019-05-08 DIAGNOSIS — F064 Anxiety disorder due to known physiological condition: Secondary | ICD-10-CM | POA: Diagnosis not present

## 2019-05-08 DIAGNOSIS — F0281 Dementia in other diseases classified elsewhere with behavioral disturbance: Secondary | ICD-10-CM | POA: Diagnosis not present

## 2019-05-08 DIAGNOSIS — F5105 Insomnia due to other mental disorder: Secondary | ICD-10-CM | POA: Diagnosis not present

## 2019-05-08 DIAGNOSIS — F331 Major depressive disorder, recurrent, moderate: Secondary | ICD-10-CM | POA: Diagnosis not present

## 2019-05-22 DIAGNOSIS — F331 Major depressive disorder, recurrent, moderate: Secondary | ICD-10-CM | POA: Diagnosis not present

## 2019-05-22 DIAGNOSIS — G300 Alzheimer's disease with early onset: Secondary | ICD-10-CM | POA: Diagnosis not present

## 2019-05-22 DIAGNOSIS — F064 Anxiety disorder due to known physiological condition: Secondary | ICD-10-CM | POA: Diagnosis not present

## 2019-05-22 DIAGNOSIS — F5105 Insomnia due to other mental disorder: Secondary | ICD-10-CM | POA: Diagnosis not present

## 2019-05-22 DIAGNOSIS — F0281 Dementia in other diseases classified elsewhere with behavioral disturbance: Secondary | ICD-10-CM | POA: Diagnosis not present

## 2019-05-30 DIAGNOSIS — Z23 Encounter for immunization: Secondary | ICD-10-CM | POA: Diagnosis not present

## 2019-06-01 DIAGNOSIS — R4181 Age-related cognitive decline: Secondary | ICD-10-CM | POA: Diagnosis not present

## 2019-06-01 DIAGNOSIS — R451 Restlessness and agitation: Secondary | ICD-10-CM | POA: Diagnosis not present

## 2019-06-01 DIAGNOSIS — R269 Unspecified abnormalities of gait and mobility: Secondary | ICD-10-CM | POA: Diagnosis not present

## 2019-06-05 DIAGNOSIS — F064 Anxiety disorder due to known physiological condition: Secondary | ICD-10-CM | POA: Diagnosis not present

## 2019-06-05 DIAGNOSIS — F331 Major depressive disorder, recurrent, moderate: Secondary | ICD-10-CM | POA: Diagnosis not present

## 2019-06-05 DIAGNOSIS — F0281 Dementia in other diseases classified elsewhere with behavioral disturbance: Secondary | ICD-10-CM | POA: Diagnosis not present

## 2019-06-05 DIAGNOSIS — F5105 Insomnia due to other mental disorder: Secondary | ICD-10-CM | POA: Diagnosis not present

## 2019-06-05 DIAGNOSIS — G300 Alzheimer's disease with early onset: Secondary | ICD-10-CM | POA: Diagnosis not present

## 2019-06-19 DIAGNOSIS — F5105 Insomnia due to other mental disorder: Secondary | ICD-10-CM | POA: Diagnosis not present

## 2019-06-19 DIAGNOSIS — F331 Major depressive disorder, recurrent, moderate: Secondary | ICD-10-CM | POA: Diagnosis not present

## 2019-06-19 DIAGNOSIS — F064 Anxiety disorder due to known physiological condition: Secondary | ICD-10-CM | POA: Diagnosis not present

## 2019-06-19 DIAGNOSIS — F0281 Dementia in other diseases classified elsewhere with behavioral disturbance: Secondary | ICD-10-CM | POA: Diagnosis not present

## 2019-06-19 DIAGNOSIS — G300 Alzheimer's disease with early onset: Secondary | ICD-10-CM | POA: Diagnosis not present

## 2019-06-29 DIAGNOSIS — G9340 Encephalopathy, unspecified: Secondary | ICD-10-CM | POA: Diagnosis not present

## 2019-06-29 DIAGNOSIS — K219 Gastro-esophageal reflux disease without esophagitis: Secondary | ICD-10-CM | POA: Diagnosis not present

## 2019-06-29 DIAGNOSIS — M1991 Primary osteoarthritis, unspecified site: Secondary | ICD-10-CM | POA: Diagnosis not present

## 2019-07-03 DIAGNOSIS — G300 Alzheimer's disease with early onset: Secondary | ICD-10-CM | POA: Diagnosis not present

## 2019-07-03 DIAGNOSIS — F5105 Insomnia due to other mental disorder: Secondary | ICD-10-CM | POA: Diagnosis not present

## 2019-07-03 DIAGNOSIS — F331 Major depressive disorder, recurrent, moderate: Secondary | ICD-10-CM | POA: Diagnosis not present

## 2019-07-03 DIAGNOSIS — F064 Anxiety disorder due to known physiological condition: Secondary | ICD-10-CM | POA: Diagnosis not present

## 2019-07-03 DIAGNOSIS — F0281 Dementia in other diseases classified elsewhere with behavioral disturbance: Secondary | ICD-10-CM | POA: Diagnosis not present

## 2019-07-17 DIAGNOSIS — F331 Major depressive disorder, recurrent, moderate: Secondary | ICD-10-CM | POA: Diagnosis not present

## 2019-07-17 DIAGNOSIS — G300 Alzheimer's disease with early onset: Secondary | ICD-10-CM | POA: Diagnosis not present

## 2019-07-17 DIAGNOSIS — F064 Anxiety disorder due to known physiological condition: Secondary | ICD-10-CM | POA: Diagnosis not present

## 2019-07-17 DIAGNOSIS — F0281 Dementia in other diseases classified elsewhere with behavioral disturbance: Secondary | ICD-10-CM | POA: Diagnosis not present

## 2019-07-17 DIAGNOSIS — F5105 Insomnia due to other mental disorder: Secondary | ICD-10-CM | POA: Diagnosis not present

## 2019-07-27 DIAGNOSIS — R4181 Age-related cognitive decline: Secondary | ICD-10-CM | POA: Diagnosis not present

## 2019-07-27 DIAGNOSIS — F99 Mental disorder, not otherwise specified: Secondary | ICD-10-CM | POA: Diagnosis not present

## 2019-07-27 DIAGNOSIS — F0390 Unspecified dementia without behavioral disturbance: Secondary | ICD-10-CM | POA: Diagnosis not present

## 2019-07-27 DIAGNOSIS — R269 Unspecified abnormalities of gait and mobility: Secondary | ICD-10-CM | POA: Diagnosis not present

## 2019-07-27 DIAGNOSIS — R451 Restlessness and agitation: Secondary | ICD-10-CM | POA: Diagnosis not present

## 2019-07-31 DIAGNOSIS — G300 Alzheimer's disease with early onset: Secondary | ICD-10-CM | POA: Diagnosis not present

## 2019-07-31 DIAGNOSIS — F0281 Dementia in other diseases classified elsewhere with behavioral disturbance: Secondary | ICD-10-CM | POA: Diagnosis not present

## 2019-07-31 DIAGNOSIS — F331 Major depressive disorder, recurrent, moderate: Secondary | ICD-10-CM | POA: Diagnosis not present

## 2019-07-31 DIAGNOSIS — F064 Anxiety disorder due to known physiological condition: Secondary | ICD-10-CM | POA: Diagnosis not present

## 2019-07-31 DIAGNOSIS — F5105 Insomnia due to other mental disorder: Secondary | ICD-10-CM | POA: Diagnosis not present

## 2019-08-14 DIAGNOSIS — F331 Major depressive disorder, recurrent, moderate: Secondary | ICD-10-CM | POA: Diagnosis not present

## 2019-08-14 DIAGNOSIS — F0281 Dementia in other diseases classified elsewhere with behavioral disturbance: Secondary | ICD-10-CM | POA: Diagnosis not present

## 2019-08-14 DIAGNOSIS — G300 Alzheimer's disease with early onset: Secondary | ICD-10-CM | POA: Diagnosis not present

## 2019-08-14 DIAGNOSIS — F064 Anxiety disorder due to known physiological condition: Secondary | ICD-10-CM | POA: Diagnosis not present

## 2019-08-14 DIAGNOSIS — F5105 Insomnia due to other mental disorder: Secondary | ICD-10-CM | POA: Diagnosis not present

## 2019-08-15 DIAGNOSIS — R279 Unspecified lack of coordination: Secondary | ICD-10-CM | POA: Diagnosis not present

## 2019-08-15 DIAGNOSIS — R52 Pain, unspecified: Secondary | ICD-10-CM | POA: Diagnosis not present

## 2019-08-15 DIAGNOSIS — R410 Disorientation, unspecified: Secondary | ICD-10-CM | POA: Diagnosis not present

## 2019-08-15 DIAGNOSIS — R0781 Pleurodynia: Secondary | ICD-10-CM | POA: Diagnosis not present

## 2019-08-15 DIAGNOSIS — Y998 Other external cause status: Secondary | ICD-10-CM | POA: Diagnosis not present

## 2019-08-15 DIAGNOSIS — W19XXXA Unspecified fall, initial encounter: Secondary | ICD-10-CM | POA: Diagnosis not present

## 2019-08-15 DIAGNOSIS — S51012A Laceration without foreign body of left elbow, initial encounter: Secondary | ICD-10-CM | POA: Diagnosis not present

## 2019-08-15 DIAGNOSIS — R4182 Altered mental status, unspecified: Secondary | ICD-10-CM | POA: Diagnosis not present

## 2019-08-15 DIAGNOSIS — R58 Hemorrhage, not elsewhere classified: Secondary | ICD-10-CM | POA: Diagnosis not present

## 2019-08-15 DIAGNOSIS — I712 Thoracic aortic aneurysm, without rupture: Secondary | ICD-10-CM | POA: Diagnosis not present

## 2019-08-15 DIAGNOSIS — R404 Transient alteration of awareness: Secondary | ICD-10-CM | POA: Diagnosis not present

## 2019-08-15 DIAGNOSIS — S2232XA Fracture of one rib, left side, initial encounter for closed fracture: Secondary | ICD-10-CM | POA: Diagnosis not present

## 2019-08-15 DIAGNOSIS — S0990XA Unspecified injury of head, initial encounter: Secondary | ICD-10-CM | POA: Diagnosis not present

## 2019-08-15 DIAGNOSIS — M47812 Spondylosis without myelopathy or radiculopathy, cervical region: Secondary | ICD-10-CM | POA: Diagnosis not present

## 2019-08-15 DIAGNOSIS — I77810 Thoracic aortic ectasia: Secondary | ICD-10-CM | POA: Diagnosis not present

## 2019-08-15 DIAGNOSIS — Z743 Need for continuous supervision: Secondary | ICD-10-CM | POA: Diagnosis not present

## 2019-08-15 DIAGNOSIS — S199XXA Unspecified injury of neck, initial encounter: Secondary | ICD-10-CM | POA: Diagnosis not present

## 2019-08-15 DIAGNOSIS — S0012XA Contusion of left eyelid and periocular area, initial encounter: Secondary | ICD-10-CM | POA: Diagnosis not present

## 2019-08-15 DIAGNOSIS — S161XXA Strain of muscle, fascia and tendon at neck level, initial encounter: Secondary | ICD-10-CM | POA: Diagnosis not present

## 2019-08-15 DIAGNOSIS — W010XXA Fall on same level from slipping, tripping and stumbling without subsequent striking against object, initial encounter: Secondary | ICD-10-CM | POA: Diagnosis not present

## 2019-08-15 DIAGNOSIS — D1779 Benign lipomatous neoplasm of other sites: Secondary | ICD-10-CM | POA: Diagnosis not present

## 2019-08-24 DIAGNOSIS — M1991 Primary osteoarthritis, unspecified site: Secondary | ICD-10-CM | POA: Diagnosis not present

## 2019-08-24 DIAGNOSIS — G9340 Encephalopathy, unspecified: Secondary | ICD-10-CM | POA: Diagnosis not present

## 2019-08-24 DIAGNOSIS — K219 Gastro-esophageal reflux disease without esophagitis: Secondary | ICD-10-CM | POA: Diagnosis not present

## 2019-08-28 DIAGNOSIS — F331 Major depressive disorder, recurrent, moderate: Secondary | ICD-10-CM | POA: Diagnosis not present

## 2019-08-28 DIAGNOSIS — F064 Anxiety disorder due to known physiological condition: Secondary | ICD-10-CM | POA: Diagnosis not present

## 2019-08-28 DIAGNOSIS — G300 Alzheimer's disease with early onset: Secondary | ICD-10-CM | POA: Diagnosis not present

## 2019-08-28 DIAGNOSIS — F5105 Insomnia due to other mental disorder: Secondary | ICD-10-CM | POA: Diagnosis not present

## 2019-08-28 DIAGNOSIS — F0281 Dementia in other diseases classified elsewhere with behavioral disturbance: Secondary | ICD-10-CM | POA: Diagnosis not present

## 2019-09-11 DIAGNOSIS — F0281 Dementia in other diseases classified elsewhere with behavioral disturbance: Secondary | ICD-10-CM | POA: Diagnosis not present

## 2019-09-11 DIAGNOSIS — F064 Anxiety disorder due to known physiological condition: Secondary | ICD-10-CM | POA: Diagnosis not present

## 2019-09-11 DIAGNOSIS — F331 Major depressive disorder, recurrent, moderate: Secondary | ICD-10-CM | POA: Diagnosis not present

## 2019-09-11 DIAGNOSIS — F5105 Insomnia due to other mental disorder: Secondary | ICD-10-CM | POA: Diagnosis not present

## 2019-09-11 DIAGNOSIS — G300 Alzheimer's disease with early onset: Secondary | ICD-10-CM | POA: Diagnosis not present

## 2019-09-14 DIAGNOSIS — R4181 Age-related cognitive decline: Secondary | ICD-10-CM | POA: Diagnosis not present

## 2019-09-14 DIAGNOSIS — R451 Restlessness and agitation: Secondary | ICD-10-CM | POA: Diagnosis not present

## 2019-09-14 DIAGNOSIS — R269 Unspecified abnormalities of gait and mobility: Secondary | ICD-10-CM | POA: Diagnosis not present

## 2019-09-25 DIAGNOSIS — F0281 Dementia in other diseases classified elsewhere with behavioral disturbance: Secondary | ICD-10-CM | POA: Diagnosis not present

## 2019-09-25 DIAGNOSIS — F064 Anxiety disorder due to known physiological condition: Secondary | ICD-10-CM | POA: Diagnosis not present

## 2019-09-25 DIAGNOSIS — F5105 Insomnia due to other mental disorder: Secondary | ICD-10-CM | POA: Diagnosis not present

## 2019-09-25 DIAGNOSIS — F331 Major depressive disorder, recurrent, moderate: Secondary | ICD-10-CM | POA: Diagnosis not present

## 2019-09-25 DIAGNOSIS — G300 Alzheimer's disease with early onset: Secondary | ICD-10-CM | POA: Diagnosis not present

## 2019-10-24 DIAGNOSIS — M1991 Primary osteoarthritis, unspecified site: Secondary | ICD-10-CM | POA: Diagnosis not present

## 2019-10-24 DIAGNOSIS — K219 Gastro-esophageal reflux disease without esophagitis: Secondary | ICD-10-CM | POA: Diagnosis not present

## 2019-10-24 DIAGNOSIS — G9340 Encephalopathy, unspecified: Secondary | ICD-10-CM | POA: Diagnosis not present

## 2019-10-30 DIAGNOSIS — F0281 Dementia in other diseases classified elsewhere with behavioral disturbance: Secondary | ICD-10-CM | POA: Diagnosis not present

## 2019-10-30 DIAGNOSIS — F064 Anxiety disorder due to known physiological condition: Secondary | ICD-10-CM | POA: Diagnosis not present

## 2019-10-30 DIAGNOSIS — F5105 Insomnia due to other mental disorder: Secondary | ICD-10-CM | POA: Diagnosis not present

## 2019-10-30 DIAGNOSIS — F331 Major depressive disorder, recurrent, moderate: Secondary | ICD-10-CM | POA: Diagnosis not present

## 2019-10-30 DIAGNOSIS — G300 Alzheimer's disease with early onset: Secondary | ICD-10-CM | POA: Diagnosis not present

## 2019-11-08 DIAGNOSIS — R4181 Age-related cognitive decline: Secondary | ICD-10-CM | POA: Diagnosis not present

## 2019-11-08 DIAGNOSIS — R269 Unspecified abnormalities of gait and mobility: Secondary | ICD-10-CM | POA: Diagnosis not present

## 2019-11-08 DIAGNOSIS — R451 Restlessness and agitation: Secondary | ICD-10-CM | POA: Diagnosis not present

## 2019-11-13 DIAGNOSIS — F331 Major depressive disorder, recurrent, moderate: Secondary | ICD-10-CM | POA: Diagnosis not present

## 2019-11-13 DIAGNOSIS — F5105 Insomnia due to other mental disorder: Secondary | ICD-10-CM | POA: Diagnosis not present

## 2019-11-13 DIAGNOSIS — F0281 Dementia in other diseases classified elsewhere with behavioral disturbance: Secondary | ICD-10-CM | POA: Diagnosis not present

## 2019-11-13 DIAGNOSIS — F064 Anxiety disorder due to known physiological condition: Secondary | ICD-10-CM | POA: Diagnosis not present

## 2019-11-13 DIAGNOSIS — G300 Alzheimer's disease with early onset: Secondary | ICD-10-CM | POA: Diagnosis not present

## 2019-12-04 DIAGNOSIS — K219 Gastro-esophageal reflux disease without esophagitis: Secondary | ICD-10-CM | POA: Diagnosis not present

## 2019-12-04 DIAGNOSIS — M1991 Primary osteoarthritis, unspecified site: Secondary | ICD-10-CM | POA: Diagnosis not present

## 2019-12-04 DIAGNOSIS — G9340 Encephalopathy, unspecified: Secondary | ICD-10-CM | POA: Diagnosis not present

## 2019-12-14 DIAGNOSIS — M25551 Pain in right hip: Secondary | ICD-10-CM | POA: Diagnosis not present

## 2019-12-18 DIAGNOSIS — G300 Alzheimer's disease with early onset: Secondary | ICD-10-CM | POA: Diagnosis not present

## 2019-12-18 DIAGNOSIS — F0281 Dementia in other diseases classified elsewhere with behavioral disturbance: Secondary | ICD-10-CM | POA: Diagnosis not present

## 2019-12-18 DIAGNOSIS — F331 Major depressive disorder, recurrent, moderate: Secondary | ICD-10-CM | POA: Diagnosis not present

## 2019-12-18 DIAGNOSIS — F064 Anxiety disorder due to known physiological condition: Secondary | ICD-10-CM | POA: Diagnosis not present

## 2019-12-18 DIAGNOSIS — F5105 Insomnia due to other mental disorder: Secondary | ICD-10-CM | POA: Diagnosis not present

## 2019-12-25 DIAGNOSIS — Z87820 Personal history of traumatic brain injury: Secondary | ICD-10-CM | POA: Diagnosis not present

## 2019-12-25 DIAGNOSIS — Z982 Presence of cerebrospinal fluid drainage device: Secondary | ICD-10-CM | POA: Diagnosis not present

## 2019-12-25 DIAGNOSIS — F0280 Dementia in other diseases classified elsewhere without behavioral disturbance: Secondary | ICD-10-CM | POA: Diagnosis not present

## 2019-12-25 DIAGNOSIS — G301 Alzheimer's disease with late onset: Secondary | ICD-10-CM | POA: Diagnosis not present

## 2019-12-25 DIAGNOSIS — F802 Mixed receptive-expressive language disorder: Secondary | ICD-10-CM | POA: Diagnosis not present

## 2020-01-01 DIAGNOSIS — F331 Major depressive disorder, recurrent, moderate: Secondary | ICD-10-CM | POA: Diagnosis not present

## 2020-01-01 DIAGNOSIS — F064 Anxiety disorder due to known physiological condition: Secondary | ICD-10-CM | POA: Diagnosis not present

## 2020-01-01 DIAGNOSIS — F5105 Insomnia due to other mental disorder: Secondary | ICD-10-CM | POA: Diagnosis not present

## 2020-01-01 DIAGNOSIS — F0281 Dementia in other diseases classified elsewhere with behavioral disturbance: Secondary | ICD-10-CM | POA: Diagnosis not present

## 2020-01-01 DIAGNOSIS — G300 Alzheimer's disease with early onset: Secondary | ICD-10-CM | POA: Diagnosis not present

## 2020-01-03 DIAGNOSIS — R269 Unspecified abnormalities of gait and mobility: Secondary | ICD-10-CM | POA: Diagnosis not present

## 2020-01-03 DIAGNOSIS — R4181 Age-related cognitive decline: Secondary | ICD-10-CM | POA: Diagnosis not present

## 2020-01-03 DIAGNOSIS — R451 Restlessness and agitation: Secondary | ICD-10-CM | POA: Diagnosis not present

## 2020-01-15 DIAGNOSIS — F331 Major depressive disorder, recurrent, moderate: Secondary | ICD-10-CM | POA: Diagnosis not present

## 2020-01-15 DIAGNOSIS — F0281 Dementia in other diseases classified elsewhere with behavioral disturbance: Secondary | ICD-10-CM | POA: Diagnosis not present

## 2020-01-15 DIAGNOSIS — G300 Alzheimer's disease with early onset: Secondary | ICD-10-CM | POA: Diagnosis not present

## 2020-01-15 DIAGNOSIS — F064 Anxiety disorder due to known physiological condition: Secondary | ICD-10-CM | POA: Diagnosis not present

## 2020-01-15 DIAGNOSIS — F5105 Insomnia due to other mental disorder: Secondary | ICD-10-CM | POA: Diagnosis not present

## 2020-01-29 DIAGNOSIS — G300 Alzheimer's disease with early onset: Secondary | ICD-10-CM | POA: Diagnosis not present

## 2020-01-29 DIAGNOSIS — F064 Anxiety disorder due to known physiological condition: Secondary | ICD-10-CM | POA: Diagnosis not present

## 2020-01-29 DIAGNOSIS — F331 Major depressive disorder, recurrent, moderate: Secondary | ICD-10-CM | POA: Diagnosis not present

## 2020-01-29 DIAGNOSIS — F0281 Dementia in other diseases classified elsewhere with behavioral disturbance: Secondary | ICD-10-CM | POA: Diagnosis not present

## 2020-01-29 DIAGNOSIS — F5105 Insomnia due to other mental disorder: Secondary | ICD-10-CM | POA: Diagnosis not present

## 2020-01-31 DIAGNOSIS — M1991 Primary osteoarthritis, unspecified site: Secondary | ICD-10-CM | POA: Diagnosis not present

## 2020-01-31 DIAGNOSIS — G9340 Encephalopathy, unspecified: Secondary | ICD-10-CM | POA: Diagnosis not present

## 2020-01-31 DIAGNOSIS — K219 Gastro-esophageal reflux disease without esophagitis: Secondary | ICD-10-CM | POA: Diagnosis not present

## 2020-02-12 DIAGNOSIS — F5105 Insomnia due to other mental disorder: Secondary | ICD-10-CM | POA: Diagnosis not present

## 2020-02-12 DIAGNOSIS — F331 Major depressive disorder, recurrent, moderate: Secondary | ICD-10-CM | POA: Diagnosis not present

## 2020-02-12 DIAGNOSIS — F0281 Dementia in other diseases classified elsewhere with behavioral disturbance: Secondary | ICD-10-CM | POA: Diagnosis not present

## 2020-02-12 DIAGNOSIS — G300 Alzheimer's disease with early onset: Secondary | ICD-10-CM | POA: Diagnosis not present

## 2020-02-12 DIAGNOSIS — F064 Anxiety disorder due to known physiological condition: Secondary | ICD-10-CM | POA: Diagnosis not present

## 2020-02-14 ENCOUNTER — Emergency Department (HOSPITAL_BASED_OUTPATIENT_CLINIC_OR_DEPARTMENT_OTHER)
Admission: EM | Admit: 2020-02-14 | Discharge: 2020-02-15 | Disposition: A | Discharge status: Home / Self Care | Payer: Medicare Other | Attending: Emergency Medicine | Admitting: Emergency Medicine

## 2020-02-14 ENCOUNTER — Other Ambulatory Visit: Payer: Self-pay

## 2020-02-14 ENCOUNTER — Encounter (HOSPITAL_BASED_OUTPATIENT_CLINIC_OR_DEPARTMENT_OTHER): Payer: Self-pay | Admitting: Emergency Medicine

## 2020-02-14 DIAGNOSIS — W19XXXA Unspecified fall, initial encounter: Secondary | ICD-10-CM | POA: Diagnosis not present

## 2020-02-14 DIAGNOSIS — Y92129 Unspecified place in nursing home as the place of occurrence of the external cause: Secondary | ICD-10-CM | POA: Insufficient documentation

## 2020-02-14 DIAGNOSIS — F0390 Unspecified dementia without behavioral disturbance: Secondary | ICD-10-CM | POA: Diagnosis not present

## 2020-02-14 DIAGNOSIS — R404 Transient alteration of awareness: Secondary | ICD-10-CM | POA: Diagnosis not present

## 2020-02-14 DIAGNOSIS — Z0000 Encounter for general adult medical examination without abnormal findings: Secondary | ICD-10-CM | POA: Insufficient documentation

## 2020-02-14 DIAGNOSIS — Z041 Encounter for examination and observation following transport accident: Secondary | ICD-10-CM | POA: Diagnosis not present

## 2020-02-14 HISTORY — DX: Unspecified dementia, unspecified severity, without behavioral disturbance, psychotic disturbance, mood disturbance, and anxiety: F03.90

## 2020-02-14 NOTE — ED Triage Notes (Signed)
Patient presents via EMS from [facility]; per facility; patient's roommate reported patient had a fall. Fall was unwitnessed by staff; patient ambulatory and at mental status baseline; denies any complaints; patient was given routine nighttime medications pta per facility including PO ativan.

## 2020-02-15 DIAGNOSIS — M2550 Pain in unspecified joint: Secondary | ICD-10-CM | POA: Diagnosis not present

## 2020-02-15 DIAGNOSIS — W19XXXA Unspecified fall, initial encounter: Secondary | ICD-10-CM | POA: Diagnosis not present

## 2020-02-15 DIAGNOSIS — Z7401 Bed confinement status: Secondary | ICD-10-CM | POA: Diagnosis not present

## 2020-02-15 DIAGNOSIS — R5381 Other malaise: Secondary | ICD-10-CM | POA: Diagnosis not present

## 2020-02-15 NOTE — ED Provider Notes (Signed)
[facility] EMERGENCY DEPARTMENT Provider Note   CSN: [id number] Arrival date & time: 02/14/20  2323     History Chief Complaint  Patient presents with  . Fall    Renee Raymond is a 74 y.o. female.  Patient is a 74 year old female with past medical history of dementia.  She is sent from her memory care center for evaluation of fall.  The patient's roommate reported that this patient fell earlier, then got back in bed.  Patient has no recollection of the fall and can add no additional history secondary to dementia.  The history is provided by the patient.       Past Medical History:  Diagnosis Date  . Dementia [facility])     Patient Active Problem List   Diagnosis Date Noted  . 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 Postoperative wound infection 02/09/2018  . Dementia ([place]) 02/09/2018  . Normocytic anemia 02/09/2018    History reviewed. No pertinent surgical history.   OB History   No obstetric history on file.     No family history on file.  Social History   Tobacco Use  . Smoking status: Never Smoker  . Smokeless tobacco: Never Used  Vaping Use  . Vaping Use: Never used  Substance Use Topics  . Alcohol use: Not Currently  . Drug use: Not Currently    Home Medications Prior to Admission medications   Medication Sig Start Date End Date Taking? Authorizing Provider  busPIRone (BUSPAR) 10 MG tablet Take 10 mg by mouth 3 (three) times daily. 01/23/20   [provider]  cyanocobalamin (,VITAMIN B-12,) 1000 MCG/ML injection Inject into the muscle. 09/17/16   [provider]  dexamethasone (DECADRON) 0.5 MG tablet Take by mouth. 01/23/20   [provider]  divalproex (DEPAKOTE SPRINKLE) 125 MG capsule Take 125 mg by mouth 2 (two) times daily. 01/29/20   [provider]  donepezil (ARICEPT) 10 MG tablet Take by mouth. 03/16/16   [provider]  gabapentin (NEURONTIN) 300 MG capsule  01/24/18   [provider]  levothyroxine (SYNTHROID, LEVOTHROID) 25 MCG tablet Take by mouth. 09/27/17   [provider]  LORazepam (ATIVAN) 0.5 MG tablet Take 0.5 mg by mouth 2 (two) times daily as needed. 12/26/19   [provider]  memantine (NAMENDA) 10 MG tablet Take by mouth. 03/16/16   [provider]  methylPREDNISolone (MEDROL DOSEPAK) 4 MG TBPK tablet See admin instructions. see package 11/30/17   [provider]  mirtazapine (REMERON) 7.5 MG tablet Take 7.5 mg by mouth at bedtime. 01/23/20   [provider]  oxyCODONE (OXY IR/ROXICODONE) 5 MG immediate release tablet  01/24/18   [provider]  pantoprazole (PROTONIX) 40 MG tablet Take 40 mg by mouth daily. 12/19/17   [provider]  QUEtiapine (SEROQUEL) 200 MG tablet Take 200 mg by mouth at bedtime. 01/23/20   [provider]  ranitidine (ZANTAC) 150 MG tablet Take by mouth. 03/19/16   [provider]  risperiDONE (RISPERDAL) 0.25 MG tablet Take 0.25 mg by mouth at bedtime. 01/01/20   [provider]  sertraline (ZOLOFT) 25 MG tablet Take 25 mg by mouth daily. 02/12/20   [provider]  sertraline (ZOLOFT) 50 MG tablet Take 50 mg by mouth daily. 12/08/17   [provider]  traMADol Veatrice Bourbon) 50 MG tablet  01/24/18   [provider]  traZODone (DESYREL) 100 MG tablet Take 100 mg by mouth at bedtime. 01/28/20  [provider]    Allergies    Codeine  Review of Systems   Review of Systems  Unable to perform ROS: Dementia    Physical Exam Updated Vital Signs BP (!) 104/93 (BP Location: Right Arm)   Pulse 68   Temp 98.3 F (36.8 C) (Oral)   Resp 14   Ht 4\' 9"  (1.448 m)   Wt 52.6 kg   SpO2 97%   BMI 25.09 kg/m   Physical Exam Vitals and nursing note reviewed.  Constitutional:      General: She is not in acute distress.    Appearance: She is well-developed. She is not diaphoretic.     Comments: Patient is awake  and alert.  She is in no distress.  She is moving all extremities and head and neck without difficulty.  HENT:     Head: Normocephalic and atraumatic.  Neck:     Comments: There is no cervical spine tenderness.  Patient has painless range of motion in all directions. Cardiovascular:     Rate and Rhythm: Normal rate and regular rhythm.     Heart sounds: No murmur heard.  No friction rub. No gallop.   Pulmonary:     Effort: Pulmonary effort is normal. No respiratory distress.     Breath sounds: Normal breath sounds. No wheezing.  Abdominal:     General: Bowel sounds are normal. There is no distension.     Palpations: Abdomen is soft.     Tenderness: There is no abdominal tenderness.  Musculoskeletal:        General: Normal range of motion.     Cervical back: Normal range of motion and neck supple.  Skin:    General: Skin is warm and dry.  Neurological:     General: No focal deficit present.     Mental Status: She is alert and oriented to person, place, and time.     Cranial Nerves: No cranial nerve deficit.     Motor: No weakness.     ED Results / Procedures / Treatments   Labs (all labs ordered are listed, but only abnormal results are displayed) Labs Reviewed - No data to display  EKG None  Radiology No results found.  Procedures Procedures (including critical care time)  Medications Ordered in ED Medications - No data to display  ED Course  I have reviewed the triage vital signs and the nursing notes.  Pertinent labs & imaging results that were available during my care of the patient were reviewed by me and considered in my medical decision making (see chart for details).    MDM Rules/Calculators/A&P  Patient with history of dementia sent for evaluation of possible fall.  Her there is no history available other than that the patient's roommate who also suffers from dementia witnessed her fall earlier this evening.  Patient has no complaints and her physical  examination is unremarkable.  She is awake and alert, but disoriented as per baseline.  I see no evidence for trauma.  I see no indication at this time for imaging studies and feel as though discharge is appropriate with as needed return.  Final Clinical Impression(s) / ED Diagnoses Final diagnoses:  None    Rx / DC Orders ED Discharge Orders    None       Veryl Speak, MD 02/15/20 [phone]

## 2020-02-15 NOTE — ED Notes (Signed)
Attempted to call facility to give report at time of transport, rang twice with no answer.

## 2020-02-15 NOTE — Discharge Instructions (Addendum)
Continue medications as previously prescribed.  Return to the emergency department for severe headache, chest pain, neck pain, or other new and concerning symptoms.

## 2020-02-15 NOTE — ED Notes (Signed)
Attempted to call patient's daughter Theadora Rama, no answer.

## 2020-02-18 DIAGNOSIS — E039 Hypothyroidism, unspecified: Secondary | ICD-10-CM | POA: Diagnosis not present

## 2020-02-18 DIAGNOSIS — I10 Essential (primary) hypertension: Secondary | ICD-10-CM | POA: Diagnosis not present

## 2020-02-18 DIAGNOSIS — E119 Type 2 diabetes mellitus without complications: Secondary | ICD-10-CM | POA: Diagnosis not present

## 2020-02-18 DIAGNOSIS — E559 Vitamin D deficiency, unspecified: Secondary | ICD-10-CM | POA: Diagnosis not present

## 2020-02-18 DIAGNOSIS — E782 Mixed hyperlipidemia: Secondary | ICD-10-CM | POA: Diagnosis not present

## 2020-02-21 DIAGNOSIS — M1991 Primary osteoarthritis, unspecified site: Secondary | ICD-10-CM | POA: Diagnosis not present

## 2020-02-21 DIAGNOSIS — K219 Gastro-esophageal reflux disease without esophagitis: Secondary | ICD-10-CM | POA: Diagnosis not present

## 2020-02-21 DIAGNOSIS — G9340 Encephalopathy, unspecified: Secondary | ICD-10-CM | POA: Diagnosis not present

## 2020-02-26 DIAGNOSIS — F0281 Dementia in other diseases classified elsewhere with behavioral disturbance: Secondary | ICD-10-CM | POA: Diagnosis not present

## 2020-02-26 DIAGNOSIS — F331 Major depressive disorder, recurrent, moderate: Secondary | ICD-10-CM | POA: Diagnosis not present

## 2020-02-26 DIAGNOSIS — G300 Alzheimer's disease with early onset: Secondary | ICD-10-CM | POA: Diagnosis not present

## 2020-02-26 DIAGNOSIS — F064 Anxiety disorder due to known physiological condition: Secondary | ICD-10-CM | POA: Diagnosis not present

## 2020-02-26 DIAGNOSIS — F5105 Insomnia due to other mental disorder: Secondary | ICD-10-CM | POA: Diagnosis not present

## 2020-02-27 DIAGNOSIS — F0281 Dementia in other diseases classified elsewhere with behavioral disturbance: Secondary | ICD-10-CM | POA: Diagnosis not present

## 2020-02-27 DIAGNOSIS — M1991 Primary osteoarthritis, unspecified site: Secondary | ICD-10-CM | POA: Diagnosis not present

## 2020-02-27 DIAGNOSIS — G300 Alzheimer's disease with early onset: Secondary | ICD-10-CM | POA: Diagnosis not present

## 2020-02-27 DIAGNOSIS — K219 Gastro-esophageal reflux disease without esophagitis: Secondary | ICD-10-CM | POA: Diagnosis not present

## 2020-02-28 DIAGNOSIS — E782 Mixed hyperlipidemia: Secondary | ICD-10-CM | POA: Diagnosis not present

## 2020-02-28 DIAGNOSIS — I10 Essential (primary) hypertension: Secondary | ICD-10-CM | POA: Diagnosis not present

## 2020-02-28 DIAGNOSIS — E039 Hypothyroidism, unspecified: Secondary | ICD-10-CM | POA: Diagnosis not present

## 2020-03-10 DIAGNOSIS — F5105 Insomnia due to other mental disorder: Secondary | ICD-10-CM | POA: Diagnosis not present

## 2020-03-10 DIAGNOSIS — F064 Anxiety disorder due to known physiological condition: Secondary | ICD-10-CM | POA: Diagnosis not present

## 2020-03-10 DIAGNOSIS — F0281 Dementia in other diseases classified elsewhere with behavioral disturbance: Secondary | ICD-10-CM | POA: Diagnosis not present

## 2020-03-10 DIAGNOSIS — G300 Alzheimer's disease with early onset: Secondary | ICD-10-CM | POA: Diagnosis not present

## 2020-03-10 DIAGNOSIS — F331 Major depressive disorder, recurrent, moderate: Secondary | ICD-10-CM | POA: Diagnosis not present

## 2020-03-26 DIAGNOSIS — F5109 Other insomnia not due to a substance or known physiological condition: Secondary | ICD-10-CM | POA: Diagnosis not present

## 2020-03-26 DIAGNOSIS — S81811A Laceration without foreign body, right lower leg, initial encounter: Secondary | ICD-10-CM | POA: Diagnosis not present

## 2020-03-26 DIAGNOSIS — F0390 Unspecified dementia without behavioral disturbance: Secondary | ICD-10-CM | POA: Diagnosis not present

## 2020-03-28 DIAGNOSIS — S81811D Laceration without foreign body, right lower leg, subsequent encounter: Secondary | ICD-10-CM | POA: Diagnosis not present

## 2020-04-04 ENCOUNTER — Other Ambulatory Visit: Payer: Self-pay

## 2020-04-04 ENCOUNTER — Emergency Department (HOSPITAL_BASED_OUTPATIENT_CLINIC_OR_DEPARTMENT_OTHER)
Admission: EM | Admit: 2020-04-04 | Discharge: 2020-04-05 | Disposition: A | Discharge status: Home / Self Care | Payer: Medicare Other | Attending: Emergency Medicine | Admitting: Emergency Medicine

## 2020-04-04 ENCOUNTER — Encounter (HOSPITAL_BASED_OUTPATIENT_CLINIC_OR_DEPARTMENT_OTHER): Payer: Self-pay | Admitting: Emergency Medicine

## 2020-04-04 ENCOUNTER — Emergency Department (HOSPITAL_BASED_OUTPATIENT_CLINIC_OR_DEPARTMENT_OTHER): Payer: Medicare Other

## 2020-04-04 DIAGNOSIS — S7001XA Contusion of right hip, initial encounter: Secondary | ICD-10-CM | POA: Insufficient documentation

## 2020-04-04 DIAGNOSIS — N3289 Other specified disorders of bladder: Secondary | ICD-10-CM | POA: Diagnosis not present

## 2020-04-04 DIAGNOSIS — M160 Bilateral primary osteoarthritis of hip: Secondary | ICD-10-CM | POA: Diagnosis not present

## 2020-04-04 DIAGNOSIS — R102 Pelvic and perineal pain: Secondary | ICD-10-CM | POA: Insufficient documentation

## 2020-04-04 DIAGNOSIS — R52 Pain, unspecified: Secondary | ICD-10-CM | POA: Diagnosis not present

## 2020-04-04 DIAGNOSIS — F0390 Unspecified dementia without behavioral disturbance: Secondary | ICD-10-CM | POA: Insufficient documentation

## 2020-04-04 DIAGNOSIS — W19XXXA Unspecified fall, initial encounter: Secondary | ICD-10-CM | POA: Insufficient documentation

## 2020-04-04 DIAGNOSIS — S79911A Unspecified injury of right hip, initial encounter: Secondary | ICD-10-CM | POA: Diagnosis present

## 2020-04-04 DIAGNOSIS — Z981 Arthrodesis status: Secondary | ICD-10-CM | POA: Diagnosis not present

## 2020-04-04 NOTE — ED Notes (Signed)
Patient transported to CT 

## 2020-04-04 NOTE — ED Notes (Signed)
Pt able to ambulate w/o issue 

## 2020-04-04 NOTE — ED Triage Notes (Signed)
Buttocks pain after a witness fall tonight. Did not his her head. Got up after on her own, no shortening or rotation. Pelvis stable. From memory care facility [facility].

## 2020-04-04 NOTE — ED Provider Notes (Signed)
[facility] Provider Note: Georgena Spurling, MD, FACEP  CSN: [id number] MRN: [id number] ARRIVAL: 04/04/20 at [place]: [place]  Fall  Level 5 caveat: Dementia HISTORY OF PRESENT ILLNESS  04/04/20 10:37 PM [facility] is a 74 y.o. female who had a witnessed fall at her memory care facility.  She did not hit her head.  She got up on her own after falling.  There is no shortening or rotation noted of either lower extremity.  Pelvis is reported stable.  EMS reports apparent pain in the right hip.    Past Medical History:  Diagnosis Date  . Dementia ([place])     History reviewed. No pertinent surgical history.  No family history on file.  Social History   Tobacco Use  . Smoking status: Never Smoker  . Smokeless tobacco: Never Used  Vaping Use  . Vaping Use: Never used  Substance Use Topics  . Alcohol use: Not Currently  . Drug use: Not Currently    Prior to Admission medications   Medication Sig Start Date End Date Taking? Authorizing Provider  busPIRone (BUSPAR) 10 MG tablet Take 10 mg by mouth 3 (three) times daily. 01/23/20   [provider]  cyanocobalamin (,VITAMIN B-12,) 1000 MCG/ML injection Inject into the muscle. 09/17/16   [provider]  dexamethasone (DECADRON) 0.5 MG tablet Take by mouth. 01/23/20   [provider]  divalproex (DEPAKOTE SPRINKLE) 125 MG capsule Take 125 mg by mouth 2 (two) times daily. 01/29/20   [provider]  donepezil (ARICEPT) 10 MG tablet Take by mouth. 03/16/16   [provider]  gabapentin (NEURONTIN) 300 MG capsule  01/24/18   [provider]  levothyroxine (SYNTHROID, LEVOTHROID) 25 MCG tablet Take by mouth. 09/27/17   [provider]  LORazepam (ATIVAN) 0.5 MG tablet Take 0.5 mg by mouth 2 (two) times daily as needed. 12/26/19   [provider]  memantine (NAMENDA) 10 MG tablet Take by mouth. 03/16/16   [provider]   methylPREDNISolone (MEDROL DOSEPAK) 4 MG TBPK tablet See admin instructions. see package 11/30/17   [provider]  mirtazapine (REMERON) 7.5 MG tablet Take 7.5 mg by mouth at bedtime. 01/23/20   [provider]  oxyCODONE (OXY IR/ROXICODONE) 5 MG immediate release tablet  01/24/18   [provider]  pantoprazole (PROTONIX) 40 MG tablet Take 40 mg by mouth daily. 12/19/17   [provider]  QUEtiapine (SEROQUEL) 200 MG tablet Take 200 mg by mouth at bedtime. 01/23/20   [provider]  ranitidine (ZANTAC) 150 MG tablet Take by mouth. 03/19/16   [provider]  risperiDONE (RISPERDAL) 0.25 MG tablet Take 0.25 mg by mouth at bedtime. 01/01/20   [provider]  sertraline (ZOLOFT) 25 MG tablet Take 25 mg by mouth daily. 02/12/20   [provider]  sertraline (ZOLOFT) 50 MG tablet Take 50 mg by mouth daily. 12/08/17   [provider]  traMADol Veatrice Bourbon) 50 MG tablet  01/24/18   [provider]  traZODone (DESYREL) 100 MG tablet Take 100 mg by mouth at bedtime. 01/28/20   [provider]    Allergies Codeine   REVIEW OF SYSTEMS  Cannot assess due to dementia.   PHYSICAL EXAMINATION  Initial Vital Signs Blood pressure 125/68, pulse 68, temperature 98.2 F (36.8 C), resp. rate 20, SpO2 95 %.  Examination General: Well-developed, well-nourished female in no acute distress; appearance consistent with age of record HENT:  normocephalic; atraumatic Eyes: pupils equal, round and reactive to light; extraocular muscles grossly intact Neck: supple; nontender Heart: regular rate and rhythm Lungs: clear to auscultation bilaterally Abdomen: soft; nondistended; nontender; bowel sounds present Extremities: No deformity; pulses normal; no pain on passive movement of right hip; tenderness just distal to right greater trochanter Neurologic: Awake, alert, confused; motor function intact in all extremities and  symmetric; no facial droop Skin: Warm and dry Psychiatric: Normal mood and affect   RESULTS  Summary of this visit's results, reviewed and interpreted by myself:   EKG Interpretation  Date/Time:    Ventricular Rate:    PR Interval:    QRS Duration:   QT Interval:    QTC Calculation:   R Axis:     Text Interpretation:        Laboratory Studies: No results found for this or any previous visit (from the past 24 hour(s)). Imaging Studies: CT PELVIS WO CONTRAST  Result Date: 04/04/2020 CLINICAL DATA:  Pain status post fall EXAM: CT PELVIS WITHOUT CONTRAST TECHNIQUE: Multidetector CT imaging of the pelvis was performed following the standard protocol without intravenous contrast. COMPARISON:  None. FINDINGS: Urinary Tract:  The urinary bladder is significantly distended. Bowel: There are scattered colonic diverticula without CT evidence for diverticulitis involving the visualized portions of the colon. The partially visualized VP shunt catheter is noted. Vascular/Lymphatic: Vascular calcifications are noted of the distal abdominal aorta. There are no pathologically enlarged lymph nodes. Reproductive:  The patient is status post prior hysterectomy. Other:  None. Musculoskeletal: The patient is status post prior posterior fusion of the lower lumbar spine. There is an interbody spacer at the L4-L5 level. 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 There is no evidence for an acute displaced fracture or dislocation. There is subcutaneous fat stranding involving the right gluteal region overlying the proximal right hip. IMPRESSION: 1. No evidence for an acute displaced fracture or dislocation. 2. Subcutaneous fat stranding involving the right gluteal region overlying the proximal right hip. This may represent a soft tissue contusion in the appropriate clinical setting. 3. Significantly distended urinary bladder. Aortic Atherosclerosis (ICD10-I70.0). Electronically Signed   By: Katherine Mantle  M.D.   On: 04/04/2020 23:09    ED COURSE and MDM  Nursing notes, initial and subsequent vitals signs, including pulse oximetry, reviewed and interpreted by myself.  Vitals:   04/04/20 2230  BP: 125/68  Pulse: 68  Resp: 20  Temp: 98.2 F (36.8 C)  SpO2: 95%   Medications - No data to display  Clinical presentation and CT consistent with a hematoma of the right buttock/hip area.  Patient able to ambulate without difficulty.  PROCEDURES  Procedures   ED DIAGNOSES     ICD-10-CM   1. Fall, initial encounter  W19.XXXA   2. Fall  W19.XXXA CANCELED: DG HIPS BILAT WITH PELVIS MIN 5 VIEWS    CANCELED: DG HIPS BILAT WITH PELVIS MIN 5 VIEWS  3. Traumatic hematoma of right hip, initial encounter  S70.01XA        Thy Gullikson, MD 04/04/20 [phone]

## 2020-04-08 DIAGNOSIS — F5105 Insomnia due to other mental disorder: Secondary | ICD-10-CM | POA: Diagnosis not present

## 2020-04-08 DIAGNOSIS — F064 Anxiety disorder due to known physiological condition: Secondary | ICD-10-CM | POA: Diagnosis not present

## 2020-04-08 DIAGNOSIS — G300 Alzheimer's disease with early onset: Secondary | ICD-10-CM | POA: Diagnosis not present

## 2020-04-08 DIAGNOSIS — F0281 Dementia in other diseases classified elsewhere with behavioral disturbance: Secondary | ICD-10-CM | POA: Diagnosis not present

## 2020-04-08 DIAGNOSIS — F331 Major depressive disorder, recurrent, moderate: Secondary | ICD-10-CM | POA: Diagnosis not present

## 2020-04-20 ENCOUNTER — Emergency Department (HOSPITAL_BASED_OUTPATIENT_CLINIC_OR_DEPARTMENT_OTHER)
Admission: EM | Admit: 2020-04-20 | Discharge: 2020-04-20 | Disposition: A | Discharge status: Home / Self Care | Payer: Medicare Other | Attending: Emergency Medicine | Admitting: Emergency Medicine

## 2020-04-20 ENCOUNTER — Encounter (HOSPITAL_BASED_OUTPATIENT_CLINIC_OR_DEPARTMENT_OTHER): Payer: Self-pay | Admitting: Emergency Medicine

## 2020-04-20 ENCOUNTER — Emergency Department (HOSPITAL_BASED_OUTPATIENT_CLINIC_OR_DEPARTMENT_OTHER): Payer: Medicare Other

## 2020-04-20 DIAGNOSIS — F0390 Unspecified dementia without behavioral disturbance: Secondary | ICD-10-CM | POA: Insufficient documentation

## 2020-04-20 DIAGNOSIS — R079 Chest pain, unspecified: Secondary | ICD-10-CM | POA: Insufficient documentation

## 2020-04-20 DIAGNOSIS — R410 Disorientation, unspecified: Secondary | ICD-10-CM | POA: Diagnosis not present

## 2020-04-20 DIAGNOSIS — Z79899 Other long term (current) drug therapy: Secondary | ICD-10-CM | POA: Insufficient documentation

## 2020-04-20 LAB — TROPONIN I (HIGH SENSITIVITY)
Troponin I (High Sensitivity): 2 ng/L (ref <18)
Troponin I (High Sensitivity): 2 ng/L (ref <18)

## 2020-04-20 LAB — CBC
HCT: 39.8 % (ref 36.0–46.0)
Hemoglobin: 12.6 g/dL (ref 12.0–15.0)
MCH: 31 pg (ref 26.0–34.0)
MCHC: 31.7 g/dL (ref 30.0–36.0)
MCV: 97.8 fL (ref 80.0–100.0)
Platelets: 238 10*3/uL (ref 150–400)
RBC: 4.07 MIL/uL (ref 3.87–5.11)
RDW: 13.3 % (ref 11.5–15.5)
WBC: 8.7 10*3/uL (ref 4.0–10.5)
nRBC: 0 % (ref 0.0–0.2)

## 2020-04-20 LAB — BASIC METABOLIC PANEL
Anion gap: 9 (ref 5–15)
BUN: 24 mg/dL — ABNORMAL HIGH (ref 8–23)
CO2: 28 mmol/L (ref 22–32)
Calcium: 8.5 mg/dL — ABNORMAL LOW (ref 8.9–10.3)
Chloride: 101 mmol/L (ref 98–111)
Creatinine, Ser: 1.13 mg/dL — ABNORMAL HIGH (ref 0.44–1.00)
GFR, Estimated: 52 mL/min — ABNORMAL LOW (ref >60)
Glucose, Bld: 85 mg/dL (ref 70–99)
Potassium: 3.6 mmol/L (ref 3.5–5.1)
Sodium: 138 mmol/L (ref 135–145)

## 2020-04-20 NOTE — ED Notes (Signed)
Portable Xray at bedside.

## 2020-04-20 NOTE — ED Notes (Signed)
Pts family member reports pt wants to eat, instructed that NPO until additional labs collected

## 2020-04-20 NOTE — Discharge Instructions (Signed)
Please read and follow all provided instructions.  Your diagnoses today include:  1. Chest pain, unspecified type     Tests performed today include:  An EKG of your heart  A chest x-ray  Cardiac enzymes - a blood test for heart muscle damage  Blood counts and electrolytes  Vital signs. See below for your results today.   Medications prescribed:   None  Take any prescribed medications only as directed.  Follow-up instructions: Please follow-up with your primary care provider as soon as you can for further evaluation of your symptoms.   Return instructions:  SEEK IMMEDIATE MEDICAL ATTENTION IF:  You have severe chest pain, especially if the pain is crushing or pressure-like and spreads to the arms, back, neck, or jaw, or if you have sweating, nausea (feeling sick to your stomach), or shortness of breath. THIS IS AN EMERGENCY. Don't wait to see if the pain will go away. Get medical help at once. Call 911 or 0 (operator). DO NOT drive yourself to the hospital.   Your chest pain gets worse and does not go away with rest.   You have an attack of chest pain lasting longer than usual, despite rest and treatment with the medications your caregiver has prescribed.   You wake from sleep with chest pain or shortness of breath.  You feel dizzy or faint.  You have chest pain not typical of your usual pain for which you originally saw your caregiver.   You have any other emergent concerns regarding your health.  Additional Information: Chest pain comes from many different causes. Your caregiver has diagnosed you as having chest pain that is not specific for one problem, but does not require admission.  You are at low risk for an acute heart condition or other serious illness.   Your vital signs today were: BP 100/63 (BP Location: Right Arm)   Pulse 68   Resp 18   Wt 63.1 kg   SpO2 100%   BMI 30.10 kg/m  If your blood pressure (BP) was elevated above 135/85 this visit, please  have this repeated by your doctor within one month. --------------

## 2020-04-20 NOTE — ED Provider Notes (Signed)
[facility] Provider Note   CSN: [id number] Arrival date & time: 04/20/20  1038     History Chief Complaint  Patient presents with  . Chest Pain    [facility] Raymond is a 75 y.o. female.  Patient with history of dementia, GERD presents to the emergency department today from her facility with report of chest pain.  It was reported by EMS that patient was sweeping this morning after breakfast and began to clutch her chest.  Currently the patient does not seem to be any distress and has no complaints, although she does not answer questions with appropriate responses.  Level 5 caveat due to dementia.  I reviewed patient's medication list and do not see any medications which would indicate a history of high blood pressure, high cholesterol or diabetes.  I do not see any previous cardiac work-ups with in epic.  She has had visits for falling.        Past Medical History:  Diagnosis Date  . Dementia [facility])     Patient Active Problem List   Diagnosis Date Noted  . 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 Postoperative wound infection 02/09/2018  . Dementia ([place]) 02/09/2018  . Normocytic anemia 02/09/2018    History reviewed. No pertinent surgical history.   OB History   No obstetric history on file.     History reviewed. No pertinent family history.  Social History   Tobacco Use  . Smoking status: Never Smoker  . Smokeless tobacco: Never Used  Vaping Use  . Vaping Use: Never used  Substance Use Topics  . Alcohol use: Not Currently  . Drug use: Not Currently    Home Medications Prior to Admission medications   Medication Sig Start Date End Date Taking? Authorizing Provider  busPIRone (BUSPAR) 10 MG tablet Take 10 mg by mouth 3 (three) times daily. 01/23/20   [provider]  cyanocobalamin (,VITAMIN B-12,) 1000 MCG/ML injection Inject into the muscle. 09/17/16   [provider]  dexamethasone (DECADRON) 0.5 MG tablet Take by  mouth. 01/23/20   [provider]  divalproex (DEPAKOTE SPRINKLE) 125 MG capsule Take 125 mg by mouth 2 (two) times daily. 01/29/20   [provider]  donepezil (ARICEPT) 10 MG tablet Take by mouth. 03/16/16   [provider]  gabapentin (NEURONTIN) 300 MG capsule  01/24/18   [provider]  levothyroxine (SYNTHROID, LEVOTHROID) 25 MCG tablet Take by mouth. 09/27/17   [provider]  LORazepam (ATIVAN) 0.5 MG tablet Take 0.5 mg by mouth 2 (two) times daily as needed. 12/26/19   [provider]  memantine (NAMENDA) 10 MG tablet Take by mouth. 03/16/16   [provider]  methylPREDNISolone (MEDROL DOSEPAK) 4 MG TBPK tablet See admin instructions. see package 11/30/17   [provider]  mirtazapine (REMERON) 7.5 MG tablet Take 7.5 mg by mouth at bedtime. 01/23/20   [provider]  oxyCODONE (OXY IR/ROXICODONE) 5 MG immediate release tablet  01/24/18   [provider]  pantoprazole (PROTONIX) 40 MG tablet Take 40 mg by mouth daily. 12/19/17   [provider]  QUEtiapine (SEROQUEL) 200 MG tablet Take 200 mg by mouth at bedtime. 01/23/20   [provider]  ranitidine (ZANTAC) 150 MG tablet Take by mouth. 03/19/16   [provider]  risperiDONE (RISPERDAL) 0.25 MG tablet Take 0.25 mg by mouth at bedtime. 01/01/20   [provider]  sertraline (ZOLOFT) 25 MG tablet Take 25 mg by mouth  daily. 02/12/20   [provider]  sertraline (ZOLOFT) 50 MG tablet Take 50 mg by mouth daily. 12/08/17   [provider]  traMADol Veatrice Bourbon) 50 MG tablet  01/24/18   [provider]  traZODone (DESYREL) 100 MG tablet Take 100 mg by mouth at bedtime. 01/28/20   [provider]    Allergies    Codeine  Review of Systems   Review of Systems  Unable to perform ROS: Dementia    Physical Exam Updated Vital Signs BP 106/78 (BP Location: Right Arm)   Pulse 68   Resp 19    Wt 63.1 kg   SpO2 100%   BMI 30.10 kg/m   Physical Exam Vitals and nursing note reviewed.  Constitutional:      Appearance: She is well-developed and well-nourished. She is not diaphoretic.  HENT:     Head: Normocephalic and atraumatic.     Mouth/Throat:     Mouth: Mucous membranes are normal. Mucous membranes are not dry.  Eyes:     Conjunctiva/sclera: Conjunctivae normal.  Neck:     Vascular: Normal carotid pulses. No carotid bruit or JVD.     Trachea: Trachea normal. No tracheal deviation.  Cardiovascular:     Rate and Rhythm: Normal rate and regular rhythm.     Pulses: Intact distal pulses. No decreased pulses.     Heart sounds: Normal heart sounds, S1 normal and S2 normal. No murmur heard.   Pulmonary:     Effort: Pulmonary effort is normal. No respiratory distress.     Breath sounds: No wheezing.  Chest:     Chest wall: No tenderness.  Abdominal:     General: Bowel sounds are normal. Aorta is normal.     Palpations: Abdomen is soft.     Tenderness: There is no abdominal tenderness. There is no guarding or rebound.  Musculoskeletal:        General: Normal range of motion.     Cervical back: Normal range of motion and neck supple. No muscular tenderness.  Skin:    General: Skin is warm and dry.     Coloration: Skin is not pale.     Nails: There is no cyanosis.  Neurological:     Mental Status: She is alert. She is disoriented.     Comments: Patient responds to questioning, but with random responses that are not pertinent to the question being asked.  She moves all extremities spontaneously without apparent weakness.  Psychiatric:        Mood and Affect: Mood and affect normal.     ED Results / Procedures / Treatments   Labs (all labs ordered are listed, but only abnormal results are displayed) Labs Reviewed  BASIC METABOLIC PANEL - Abnormal; Notable for the following components:      Result Value   BUN 24 (*)    Creatinine, Ser 1.13 (*)    Calcium 8.5  (*)    GFR, Estimated 52 (*)    All other components within normal limits  CBC  TROPONIN I (HIGH SENSITIVITY)  TROPONIN I (HIGH SENSITIVITY)    EKG EKG Interpretation  Date/Time:  Sunday April 20 2020 10:46:29 EST Ventricular Rate:  68 PR Interval:    QRS Duration: 83 QT Interval:  418 QTC Calculation: 445 R Axis:     Text Interpretation: Sinus rhythm Low voltage, precordial leads RSR' in V1 or V2, probably normal variant No previous ECGs available Confirmed by Gareth Morgan [phone]) on 04/20/2020 11:02:48 AM  Radiology DG Chest Portable 1 View  Result Date: 04/20/2020 CLINICAL DATA:  Chest pain EXAM: PORTABLE CHEST 1 VIEW COMPARISON:  September 15, 2018 FINDINGS: The cardiomediastinal silhouette is unchanged in contour. No pleural effusion. No pneumothorax. No acute pleuroparenchymal abnormality. Visualized abdomen is unremarkable. Osteopenia. Status post LEFT shoulder arthroplasty. Partial visualization of lumbar spine orthopedic hardware. VP shunt. Retained tubing within the RIGHT neck unchanged. IMPRESSION: No acute cardiopulmonary abnormality. Electronically Signed   By: Valentino Saxon MD   On: 04/20/2020 11:25    Procedures Procedures (including critical care time)  Medications Ordered in ED Medications - No data to display  ED Course  I have reviewed the triage vital signs and the nursing notes.  Pertinent labs & imaging results that were available during my care of the patient were reviewed by me and considered in my medical decision making (see chart for details).  Patient seen and examined.  She appears to be in no distress.  History limited by dementia.  EKG reviewed and is unremarkable.  Lab work and chest x-ray pending.  Vital signs reviewed and are as follows: BP 106/78 (BP Location: Right Arm)   Pulse 68   Resp 19   Wt 63.1 kg   SpO2 100%   BMI 30.10 kg/m   2:02 PM cardiac work-up is negative.  Patient's family member is now at bedside.  We discussed  history of chest pain this morning.  Currently the patient is at her baseline and has no complaints.  We discussed cardiac work-up and negative findings today.  Patient's family is comfortable with her being discharged back to her facility.  She will require transportation.  Counseled to return with severe chest pain, especially if the pain is crushing or pressure-like and spreads to the arms, back, neck, or jaw, or if they have sweating, nausea, or shortness of breath with the pain. They were encouraged to call 911 with these symptoms.   The patient verbalized understanding and agreed.     MDM Rules/Calculators/A&P                          Patient with reported chest pain at facility this morning.  She has dementia.  She is unable to give a history and currently reports no complaints.  Cardiac work-up performed.  Chest x-ray is clear.  EKG without concerning findings.  Troponin negative x2.  Remainder of work-up is unremarkable.  Vital signs are reassuring without tachycardia, hypoxia, blood pressure changes.  Patient is at her baseline will be discharged back to the facility.  Family member at bedside agrees with plan.   Final Clinical Impression(s) / ED Diagnoses Final diagnoses:  Chest pain, unspecified type    Rx / DC Orders ED Discharge Orders    None       Carlisle Cater, PA-C 04/20/20 1404    Gareth Morgan, MD 04/22/20 1505

## 2020-04-20 NOTE — ED Triage Notes (Signed)
Pt denies pain at this time. Arrives via EMS with report of CP

## 2020-04-20 NOTE — ED Triage Notes (Signed)
Pt from assisted living. After breakfast she was sweeping the kitchen when she started clutching her chest. Pt has dementia and is unable to articulate. Hx of GERD. EKG unremarkable per EMS.

## 2020-05-21 ENCOUNTER — Emergency Department (HOSPITAL_COMMUNITY): Payer: Medicare Other

## 2020-05-21 ENCOUNTER — Encounter (HOSPITAL_COMMUNITY): Payer: Self-pay

## 2020-05-21 ENCOUNTER — Inpatient Hospital Stay (HOSPITAL_COMMUNITY)
Admission: EM | Admit: 2020-05-21 | Discharge: 2020-05-25 | DRG: 056 | Disposition: A | Discharge status: Hospice | Payer: Medicare Other | Attending: Internal Medicine | Admitting: Internal Medicine

## 2020-05-21 DIAGNOSIS — Z20822 Contact with and (suspected) exposure to covid-19: Secondary | ICD-10-CM | POA: Diagnosis present

## 2020-05-21 DIAGNOSIS — G309 Alzheimer's disease, unspecified: Secondary | ICD-10-CM | POA: Diagnosis present

## 2020-05-21 DIAGNOSIS — N1831 Chronic kidney disease, stage 3a: Secondary | ICD-10-CM | POA: Diagnosis not present

## 2020-05-21 DIAGNOSIS — Z781 Physical restraint status: Secondary | ICD-10-CM

## 2020-05-21 DIAGNOSIS — N179 Acute kidney failure, unspecified: Secondary | ICD-10-CM | POA: Diagnosis present

## 2020-05-21 DIAGNOSIS — Z885 Allergy status to narcotic agent status: Secondary | ICD-10-CM

## 2020-05-21 DIAGNOSIS — F0390 Unspecified dementia without behavioral disturbance: Secondary | ICD-10-CM | POA: Diagnosis present

## 2020-05-21 DIAGNOSIS — E162 Hypoglycemia, unspecified: Secondary | ICD-10-CM | POA: Diagnosis present

## 2020-05-21 DIAGNOSIS — R627 Adult failure to thrive: Secondary | ICD-10-CM | POA: Diagnosis present

## 2020-05-21 DIAGNOSIS — Z515 Encounter for palliative care: Secondary | ICD-10-CM

## 2020-05-21 DIAGNOSIS — R55 Syncope and collapse: Principal | ICD-10-CM

## 2020-05-21 DIAGNOSIS — E039 Hypothyroidism, unspecified: Secondary | ICD-10-CM | POA: Diagnosis present

## 2020-05-21 DIAGNOSIS — Z9181 History of falling: Secondary | ICD-10-CM

## 2020-05-21 DIAGNOSIS — G3183 Dementia with Lewy bodies: Secondary | ICD-10-CM | POA: Diagnosis not present

## 2020-05-21 DIAGNOSIS — F0281 Dementia in other diseases classified elsewhere with behavioral disturbance: Secondary | ICD-10-CM | POA: Diagnosis present

## 2020-05-21 DIAGNOSIS — Z79890 Hormone replacement therapy (postmenopausal): Secondary | ICD-10-CM

## 2020-05-21 DIAGNOSIS — I6523 Occlusion and stenosis of bilateral carotid arteries: Secondary | ICD-10-CM | POA: Diagnosis present

## 2020-05-21 DIAGNOSIS — Z66 Do not resuscitate: Secondary | ICD-10-CM | POA: Diagnosis not present

## 2020-05-21 DIAGNOSIS — G9341 Metabolic encephalopathy: Secondary | ICD-10-CM | POA: Diagnosis present

## 2020-05-21 DIAGNOSIS — I69320 Aphasia following cerebral infarction: Secondary | ICD-10-CM

## 2020-05-21 DIAGNOSIS — N183 Chronic kidney disease, stage 3 unspecified: Secondary | ICD-10-CM

## 2020-05-21 DIAGNOSIS — Z79899 Other long term (current) drug therapy: Secondary | ICD-10-CM

## 2020-05-21 DIAGNOSIS — K219 Gastro-esophageal reflux disease without esophagitis: Secondary | ICD-10-CM | POA: Diagnosis present

## 2020-05-21 DIAGNOSIS — E860 Dehydration: Secondary | ICD-10-CM | POA: Diagnosis present

## 2020-05-21 DIAGNOSIS — Z982 Presence of cerebrospinal fluid drainage device: Secondary | ICD-10-CM

## 2020-05-21 HISTORY — DX: Chronic kidney disease, stage 3 unspecified: N18.30

## 2020-05-21 HISTORY — DX: Presence of cerebrospinal fluid drainage device: Z98.2

## 2020-05-21 HISTORY — DX: Gastro-esophageal reflux disease without esophagitis: K21.9

## 2020-05-21 LAB — CBC
HCT: 40.8 % (ref 36.0–46.0)
HCT: 42.1 % (ref 36.0–46.0)
Hemoglobin: 12.4 g/dL (ref 12.0–15.0)
Hemoglobin: 13 g/dL (ref 12.0–15.0)
MCH: 29.7 pg (ref 26.0–34.0)
MCH: 29.8 pg (ref 26.0–34.0)
MCHC: 30.4 g/dL (ref 30.0–36.0)
MCHC: 30.9 g/dL (ref 30.0–36.0)
MCV: 97.6 fL (ref 80.0–100.0)
MCV: 96.6 fL (ref 80.0–100.0)
Platelets: 248 10*3/uL (ref 150–400)
Platelets: 294 10*3/uL (ref 150–400)
RBC: 4.18 MIL/uL (ref 3.87–5.11)
RBC: 4.36 MIL/uL (ref 3.87–5.11)
RDW: 13.3 % (ref 11.5–15.5)
RDW: 13.1 % (ref 11.5–15.5)
WBC: 9.5 10*3/uL (ref 4.0–10.5)
WBC: 10.2 10*3/uL (ref 4.0–10.5)
nRBC: 0 % (ref 0.0–0.2)
nRBC: 0 % (ref 0.0–0.2)

## 2020-05-21 LAB — BASIC METABOLIC PANEL
Anion gap: 12 (ref 5–15)
BUN: 27 mg/dL — ABNORMAL HIGH (ref 8–23)
CO2: 24 mmol/L (ref 22–32)
Calcium: 8.6 mg/dL — ABNORMAL LOW (ref 8.9–10.3)
Chloride: 105 mmol/L (ref 98–111)
Creatinine, Ser: 1.23 mg/dL — ABNORMAL HIGH (ref 0.44–1.00)
GFR, Estimated: 47 mL/min — ABNORMAL LOW (ref >60)
Glucose, Bld: 93 mg/dL (ref 70–99)
Potassium: 3.8 mmol/L (ref 3.5–5.1)
Sodium: 141 mmol/L (ref 135–145)

## 2020-05-21 LAB — URINALYSIS, MICROSCOPIC (REFLEX)

## 2020-05-21 LAB — CBG MONITORING, ED: Glucose-Capillary: 88 mg/dL (ref 70–99)

## 2020-05-21 LAB — CREATININE, SERUM
Creatinine, Ser: 1.06 mg/dL — ABNORMAL HIGH (ref 0.44–1.00)
GFR, Estimated: 56 mL/min — ABNORMAL LOW (ref >60)

## 2020-05-21 LAB — TROPONIN I (HIGH SENSITIVITY)
Troponin I (High Sensitivity): 4 ng/L (ref <18)
Troponin I (High Sensitivity): 4 ng/L (ref <18)

## 2020-05-21 LAB — URINALYSIS, ROUTINE W REFLEX MICROSCOPIC
Bilirubin Urine: NEGATIVE
Glucose, UA: NEGATIVE mg/dL
Hgb urine dipstick: NEGATIVE
Ketones, ur: NEGATIVE mg/dL
Nitrite: NEGATIVE
Protein, ur: NEGATIVE mg/dL
Specific Gravity, Urine: 1.02 (ref 1.005–1.030)
pH: 7.5 (ref 5.0–8.0)

## 2020-05-21 LAB — TSH: TSH: 2.903 u[IU]/mL (ref 0.350–4.500)

## 2020-05-21 MED ORDER — LEVOTHYROXINE SODIUM 25 MCG PO TABS
25.0000 ug | ORAL_TABLET | Freq: Every day | ORAL | Status: DC
  Administered 2020-05-22 – 2020-05-23 (×2): 25 ug via ORAL
  Filled 2020-05-21 (×2): qty 1

## 2020-05-21 MED ORDER — SODIUM CHLORIDE 0.9% FLUSH
3.0000 mL | Freq: Two times a day (BID) | INTRAVENOUS | Status: DC
  Administered 2020-05-21: 3 mL via INTRAVENOUS

## 2020-05-21 MED ORDER — LACTATED RINGERS IV SOLN: INTRAVENOUS | Status: DC

## 2020-05-21 MED ORDER — TRAZODONE HCL 50 MG PO TABS
100.0000 mg | ORAL_TABLET | Freq: Every day | ORAL | Status: DC
  Administered 2020-05-21: 100 mg via ORAL
  Filled 2020-05-21: qty 2

## 2020-05-21 MED ORDER — DONEPEZIL HCL 10 MG PO TABS
10.0000 mg | ORAL_TABLET | Freq: Every day | ORAL | Status: DC
  Administered 2020-05-21 – 2020-05-22 (×2): 10 mg via ORAL
  Filled 2020-05-21 (×4): qty 1

## 2020-05-21 MED ORDER — ENOXAPARIN SODIUM 40 MG/0.4ML ~~LOC~~ SOLN
40.0000 mg | SUBCUTANEOUS | Status: DC
  Administered 2020-05-21 – 2020-05-23 (×3): 40 mg via SUBCUTANEOUS
  Filled 2020-05-21 (×3): qty 0.4

## 2020-05-21 MED ORDER — ACETAMINOPHEN 325 MG PO TABS: 650.0000 mg | ORAL_TABLET | Freq: Four times a day (QID) | ORAL | Status: DC | PRN

## 2020-05-21 MED ORDER — ACETAMINOPHEN 650 MG RE SUPP: 650.0000 mg | Freq: Four times a day (QID) | RECTAL | Status: DC | PRN

## 2020-05-21 MED ORDER — SERTRALINE HCL 50 MG PO TABS
25.0000 mg | ORAL_TABLET | Freq: Every day | ORAL | Status: DC
  Administered 2020-05-21: 25 mg via ORAL
  Filled 2020-05-21 (×2): qty 0.5
  Filled 2020-05-21: qty 1

## 2020-05-21 MED ORDER — MEMANTINE HCL 10 MG PO TABS
10.0000 mg | ORAL_TABLET | Freq: Every day | ORAL | Status: DC
  Administered 2020-05-21: 10 mg via ORAL
  Filled 2020-05-21 (×4): qty 1

## 2020-05-21 MED ORDER — LORAZEPAM 2 MG/ML IJ SOLN
1.0000 mg | Freq: Once | INTRAMUSCULAR | Status: AC
  Administered 2020-05-21: 1 mg via INTRAVENOUS

## 2020-05-21 MED ORDER — LORAZEPAM 2 MG/ML IJ SOLN
INTRAMUSCULAR | Status: AC
  Filled 2020-05-21: qty 1

## 2020-05-21 MED ORDER — SENNOSIDES-DOCUSATE SODIUM 8.6-50 MG PO TABS: 1.0000 | ORAL_TABLET | Freq: Every evening | ORAL | Status: DC | PRN

## 2020-05-21 NOTE — ED Notes (Signed)
Pt does not leave monitor on due to severe sundowning. Door is open, chest rise and fall visible. Pt just went to sleep, will place back on monitor when medications take better effect.

## 2020-05-21 NOTE — ED Provider Notes (Signed)
[facility] EMERGENCY DEPARTMENT Provider Note   CSN: [id number] Arrival date & time: 05/21/20  1112     History Chief Complaint  Patient presents with  . Loss of Consciousness    Renee Raymond is a 75 y.o. female.  Renee Raymond is a 75 y.o. female with a history of dementia, CKD, and VP shunt, who presents to the emergency department via EMS from [facility] memory care unit for evaluation of 2 witnessed syncopal episodes earlier today.  Patient is pleasantly demented but is unable to provide any history, when I walked in the room she was talking about how she had installed rods on the wall, she is unable to answer any of my questions and responds with tangential speech.  Her husband is at the bedside who reports that [facility] staff told him that she was walking back from breakfast, at baseline she walks independently and paces the hallways for most of the day.  Staff member was close by and noticed that she seemed to be falling, they caught her and lowered her to the ground, she lost consciousness causing the fall and was unresponsive for about a minute then regained consciousness, but then seem to have second episode of loss of consciousness that again lasted about a minute.  Facility staff unsure if she has been having other episodes like this.  Daughter reports that they have called her to tell her about multiple unwitnessed falls over the past few weeks and daughter questions whether she could have been having syncopal episodes at these times as well.  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  Family does not know any history of arrhythmia.  Currently patient is pleasantly demented without complaints.  History is very limited.  Husband reports she is at her  baseline mental status.  Level 5 caveat: Dementia        Past Medical History:  Diagnosis Date  . Dementia [facility])     Patient Active Problem List   Diagnosis Date Noted  . Syncope 05/21/2020  . CKD (chronic kidney disease), stage III ([place]) 05/21/2020  . Hypothyroidism 05/21/2020  . 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 Postoperative wound infection 02/09/2018  . Dementia ([place]) 02/09/2018  . Normocytic anemia 02/09/2018    History reviewed. No pertinent surgical history.   OB History   No obstetric history on file.     History reviewed. No pertinent family history.  Social History   Tobacco Use  . Smoking status: Never Smoker  . Smokeless tobacco: Never Used  Vaping Use  . Vaping Use: Never used  Substance Use Topics  . Alcohol use: Not Currently  . Drug use: Not Currently    Home Medications Prior to Admission medications   Medication Sig Start Date End Date Taking? Authorizing Provider  busPIRone (BUSPAR) 10 MG tablet Take 10 mg by mouth 3 (three) times daily. 01/23/20   [provider]  cyanocobalamin (,VITAMIN B-12,) 1000 MCG/ML injection Inject into the muscle. 09/17/16   [provider]  dexamethasone (DECADRON) 0.5 MG tablet Take by mouth. 01/23/20   [provider]  divalproex (DEPAKOTE SPRINKLE) 125 MG capsule Take 125 mg by mouth 2 (two) times daily. 01/29/20   [provider]  donepezil (ARICEPT) 10 MG tablet  Take by mouth. 03/16/16   [provider]  gabapentin (NEURONTIN) 300 MG capsule  01/24/18   [provider]  levothyroxine (SYNTHROID, LEVOTHROID) 25 MCG tablet Take by mouth. 09/27/17   [provider]  LORazepam (ATIVAN) 0.5 MG tablet Take 0.5 mg by mouth 2 (two) times daily as needed. 12/26/19   [provider]  memantine (NAMENDA) 10 MG tablet Take by mouth. 03/16/16   [provider]  methylPREDNISolone (MEDROL DOSEPAK) 4 MG TBPK tablet See admin instructions. see package  11/30/17   [provider]  mirtazapine (REMERON) 7.5 MG tablet Take 7.5 mg by mouth at bedtime. 01/23/20   [provider]  oxyCODONE (OXY IR/ROXICODONE) 5 MG immediate release tablet  01/24/18   [provider]  pantoprazole (PROTONIX) 40 MG tablet Take 40 mg by mouth daily. 12/19/17   [provider]  QUEtiapine (SEROQUEL) 200 MG tablet Take 200 mg by mouth at bedtime. 01/23/20   [provider]  ranitidine (ZANTAC) 150 MG tablet Take by mouth. 03/19/16   [provider]  risperiDONE (RISPERDAL) 0.25 MG tablet Take 0.25 mg by mouth at bedtime. 01/01/20   [provider]  sertraline (ZOLOFT) 25 MG tablet Take 25 mg by mouth daily. 02/12/20   [provider]  sertraline (ZOLOFT) 50 MG tablet Take 50 mg by mouth daily. 12/08/17   [provider]  traMADol Veatrice Bourbon) 50 MG tablet  01/24/18   [provider]  traZODone (DESYREL) 100 MG tablet Take 100 mg by mouth at bedtime. 01/28/20   [provider]    Allergies    Codeine  Review of Systems   Review of Systems  Unable to perform ROS: Dementia    Physical Exam Updated Vital Signs BP 126/72 (BP Location: Left Wrist)   Pulse 72   Temp (!) 97.4 F (36.3 C) (Oral)   Resp 16   SpO2 100%   Physical Exam Vitals and nursing note reviewed.  Constitutional:      General: She is not in acute distress.    Appearance: Normal appearance. She is well-developed and well-nourished. She is not diaphoretic.     Comments: Elderly female, alert and pleasantly demented, no acute distress  HENT:     Head: Normocephalic and atraumatic.     Mouth/Throat:     Mouth: Oropharynx is clear and moist. Mucous membranes are moist.     Pharynx: Oropharynx is clear.  Eyes:     General:        Right eye: No discharge.        Left eye: No discharge.     Extraocular Movements: Extraocular movements intact and EOM normal.     Pupils: Pupils are equal, round, and reactive  to light.  Cardiovascular:     Rate and Rhythm: Normal rate and regular rhythm.     Pulses: Intact distal pulses.     Heart sounds: Normal heart sounds. No murmur heard. No friction rub. No gallop.   Pulmonary:     Effort: Pulmonary effort is normal. No respiratory distress.     Breath sounds: Normal breath sounds. No wheezing or rales.     Comments: Respirations equal and unlabored, patient able to speak in full sentences, lungs clear to auscultation bilaterally  Abdominal:     General: Bowel sounds are normal. There is no distension.     Palpations: Abdomen is soft. There is no mass.     Tenderness: There is no abdominal tenderness. There is no  guarding.     Comments: Abdomen soft, nondistended, nontender to palpation in all quadrants without guarding or peritoneal signs   Musculoskeletal:        General: No deformity or edema.     Cervical back: Neck supple.  Skin:    General: Skin is warm and dry.     Capillary Refill: Capillary refill takes less than 2 seconds.  Neurological:     Mental Status: She is alert.     Coordination: Coordination normal.     Comments: Speech is clear but tangential, able to follow some commands, but requires frequent redirection and repetition Neuro exam somewhat limited due to patient cooperation CN grossly intact Normal strength in upper and lower extremities bilaterally  Sensation normal to light and sharp touch Moves extremities without ataxia, coordination intact   Psychiatric:        Speech: Speech is tangential.        Cognition and Memory: Cognition is impaired. Memory is impaired.     ED Results / Procedures / Treatments   Labs (all labs ordered are listed, but only abnormal results are displayed) Labs Reviewed  BASIC METABOLIC PANEL - Abnormal; Notable for the following components:      Result Value   BUN 27 (*)    Creatinine, Ser 1.23 (*)    Calcium 8.6 (*)    GFR, Estimated 47 (*)    All other components within normal limits   URINALYSIS, ROUTINE W REFLEX MICROSCOPIC - Abnormal; Notable for the following components:   Leukocytes,Ua TRACE (*)    All other components within normal limits  CREATININE, SERUM - Abnormal; Notable for the following components:   Creatinine, Ser 1.06 (*)    GFR, Estimated 56 (*)    All other components within normal limits  URINALYSIS, MICROSCOPIC (REFLEX) - Abnormal; Notable for the following components:   Bacteria, UA RARE (*)    All other components within normal limits  SARS CORONAVIRUS 2 (TAT 6-24 HRS)  CBC  CBC  TSH  BASIC METABOLIC PANEL  CBG MONITORING, ED  TROPONIN I (HIGH SENSITIVITY)  TROPONIN I (HIGH SENSITIVITY)    EKG EKG Interpretation  Date/Time:  Wednesday May 21 2020 11:30:56 EST Ventricular Rate:  86 PR Interval:  158 QRS Duration: 66 QT Interval:  382 QTC Calculation: 457 R Axis:   -176 Text Interpretation: Normal sinus rhythm Low voltage QRS Possible Anterolateral infarct , age undetermined Abnormal ECG No significant change since last tracing Confirmed by Calvert Cantor [phone]) on 05/21/2020 3:00:46 PM   Radiology CT HEAD WO CONTRAST  Result Date: 05/21/2020 CLINICAL DATA:  Mental status change. EXAM: CT HEAD WITHOUT CONTRAST TECHNIQUE: Contiguous axial images were obtained from the base of the skull through the vertex without intravenous contrast. COMPARISON:  None. FINDINGS: Brain: Similar position of a right frontal ventriculostomy catheter with the tip at the right frontal horn. Similar hypoattenuation surrounding the shunt catheter. Visualized portions of the shunt catheter appear intact. The ventricular system is slightly more decompressed than on the prior without evidence of hydrocephalus. Similar encephalomalacia subjacent to a right parietal burr hole. No evidence of acute large vascular territory infarct. No acute hemorrhage. No mass lesion or abnormal mass effect. Basal cisterns are patent. Similar additional patchy white matter  hypoattenuation, most likely related to chronic microvascular ischemia. Vascular: No hyperdense vessel identified. Calcific atherosclerosis. Skull: Right frontal and right parietal burr holes. No acute fracture. Sinuses/Orbits: Complete opacification of the visualized left maxillary sinus with internal hyperdensity. Unremarkable orbits.  Other: No mastoid effusions. IMPRESSION: 1. Similar position of the right frontal ventriculostomy catheter with slightly more decompressed ventricular system and no evidence of hydrocephalus. Correlate with evidence of over shunting. 2. Otherwise, similar chronic findings without evidence of acute intracranial abnormality. 3. Chronic left maxillary sinus disease with internal hyperdensity that may represent fungal colonization and/or inspissated secretions. Electronically Signed   By: Margaretha Sheffield MD   On: 05/21/2020 12:29   DG Chest Port 1 View  Result Date: 05/21/2020 CLINICAL DATA:  Syncopal episode EXAM: PORTABLE CHEST 1 VIEW COMPARISON:  04/20/2020 FINDINGS: The patient has taken a poor inspiration. Allowing for that, the heart and mediastinum are normal. The lungs are clear. The vascularity is normal. No effusions. Distant shoulder replacement on the left. Chronic degenerative changes of the right shoulder. IMPRESSION: Poor inspiration. No active disease. Electronically Signed   By: Nelson Chimes M.D.   On: 05/21/2020 16:10    Procedures Procedures   Medications Ordered in ED Medications  donepezil (ARICEPT) tablet 10 mg (10 mg Oral Given 05/21/20 2158)  memantine (NAMENDA) tablet 10 mg (10 mg Oral Given 05/21/20 2159)  sertraline (ZOLOFT) tablet 25 mg (25 mg Oral Given 05/21/20 2159)  traZODone (DESYREL) tablet 100 mg (100 mg Oral Given 05/21/20 2129)  levothyroxine (SYNTHROID) tablet 25 mcg (has no administration in time range)  sodium chloride flush (NS) 0.9 % injection 3 mL (3 mLs Intravenous Given 05/21/20 2132)  enoxaparin (LOVENOX) injection 40 mg (40 mg  Subcutaneous Given 05/21/20 2035)  lactated ringers infusion (0 mLs Intravenous Paused 05/21/20 2145)  acetaminophen (TYLENOL) tablet 650 mg (has no administration in time range)    Or  acetaminophen (TYLENOL) suppository 650 mg (has no administration in time range)  senna-docusate (Senokot-S) tablet 1 tablet (has no administration in time range)  LORazepam (ATIVAN) injection 1 mg (1 mg Intravenous Given 05/21/20 2144)    ED Course  I have reviewed the triage vital signs and the nursing notes.  Pertinent labs & imaging results that were available during my care of the patient were reviewed by me and considered in my medical decision making (see chart for details).    MDM Rules/Calculators/A&P                         75 year old female presents after 2 witnessed syncopal episodes where she briefly became unresponsive for about a minute, she was caught and lowered to the ground and did not fall or hit her head.  Patient is demented, husband is at bedside and reports she is currently at her baseline mental status but is not able to contribute any history.  Neurologic exam is difficult because patient will only follow some commands, she denies any acute complaints at this time.  Does have a VP shunt, daughter reports once previously when she had a loss of consciousness it was due to a shunt issue.  Basic lab work as well as EKG and CT of the head ordered from triage for evaluation.  Additional of history obtained speaking to the husband, daughter and skilled nursing facility staff.  I also reviewed patient's previous medical records via EMR.  On arrival she is pleasantly demented, vitals unremarkable and no focal findings on exam.  I have independently ordered, reviewed and interpreted all labs and imaging: EKG with normal sinus rhythm with no significant change when compared to prior tracing.  CBC: No leukocytosis, normal hemoglobin BMP: Minimal elevation in creatinine at 1.23 compared to previous  1.13 but no other significant electrolyte derangement Troponin: Not elevated  CT of the head with similar position of the right frontal ventriculostomy catheter with slightly more decompressed ventricular system and no evidence of hydrocephalus.  Otherwise similar chronic findings in no acute intracranial abnormalities.  CT does question if there could be any over shunting.  Chest x-ray with poor inspiration but no active cardiopulmonary disease noted. Urinalysis pending  I discussed case with Dr. Christella Noa with neurosurgery who reviewed patient's CT, does not see any problems with shunt and does not feel that this could be contributing to her symptoms today suspect this may be more cardiogenic, and I agree with him.  Given 2 witnessed syncopal episodes today and multiple unwitnessed falls that could have potentially been due to syncope feel patient will require admission for further evaluation of the syncopal episodes, concern for potential arrhythmia or cardiogenic syncope.  Medicine admission consult placed.  Case discussed with Dr. Tonie Griffith with [facility] hospitalist who will see and admit the patient.  Final Clinical Impression(s) / ED Diagnoses Final diagnoses:  Syncope, unspecified syncope type    Rx / DC Orders ED Discharge Orders    None       Janet Berlin 05/22/20 1444    Truddie Hidden, MD 05/22/20 [phone]

## 2020-05-21 NOTE — ED Notes (Signed)
Pt is sundowning, hitting, kicking and swinging at staff, bed alarm in place, placed on hospital bed, sitter in place, pt is not easily redirectable.

## 2020-05-21 NOTE — ED Triage Notes (Signed)
Pt from [facility] home, hx of dementia. Pt had syncopal episode earlier today. When EMS arrived pt was initially lethargic, pt now alert, ambulatory, uncooperative

## 2020-05-21 NOTE — H&P (Signed)
History and Physical    Renee Raymond QMG:[id number] DOB: 12-25-1945 DOA: 05/21/2020  PCP: Ronita Hipps, MD   Patient coming from:   SNF  Chief Complaint: Syncopal episodes  HPI: Renee Raymond is a 75 y.o. female with medical history significant for dementia, CKD stage III, hypothyroidism who lives at a care unit of SNF and presents by EMS after syncopal episodes.  Reportedly patient had 3 syncopal episodes today at the facility. History if provided by ER provider who got history from EMS. Pt is unable to provide any history due to her dementia. She is alert but cannot answer questions or provide history. No report of head injury, chest pain, fever, cough, SOB, vomiting, diarrhea or seizure activity. Pt had orthostatics done in ER that were normal. EKG and cardiac monitoring in ER shows no arrhythmia and initial troponin is negative.   Review of Systems:  Accurate review of system cannot be obtained secondary to dementia  Past Medical History:  Diagnosis Date  . Dementia ([place])     History reviewed. No pertinent surgical history.  Social History  reports that she has never smoked. She has never used smokeless tobacco. She reports previous alcohol use. She reports previous drug use.  Allergies  Allergen Reactions  . Codeine Other (See Comments)    History reviewed. No pertinent family history.   Prior to Admission medications   Medication Sig Start Date End Date Taking? Authorizing Provider  busPIRone (BUSPAR) 10 MG tablet Take 10 mg by mouth 3 (three) times daily. 01/23/20   [provider]  cyanocobalamin (,VITAMIN B-12,) 1000 MCG/ML injection Inject into the muscle. 09/17/16   [provider]  dexamethasone (DECADRON) 0.5 MG tablet Take by mouth. 01/23/20   [provider]  divalproex (DEPAKOTE SPRINKLE) 125 MG capsule Take 125 mg by mouth 2 (two) times daily. 01/29/20   [provider]  donepezil (ARICEPT) 10 MG tablet Take by  mouth. 03/16/16   [provider]  gabapentin (NEURONTIN) 300 MG capsule  01/24/18   [provider]  levothyroxine (SYNTHROID, LEVOTHROID) 25 MCG tablet Take by mouth. 09/27/17   [provider]  LORazepam (ATIVAN) 0.5 MG tablet Take 0.5 mg by mouth 2 (two) times daily as needed. 12/26/19   [provider]  memantine (NAMENDA) 10 MG tablet Take by mouth. 03/16/16   [provider]  methylPREDNISolone (MEDROL DOSEPAK) 4 MG TBPK tablet See admin instructions. see package 11/30/17   [provider]  mirtazapine (REMERON) 7.5 MG tablet Take 7.5 mg by mouth at bedtime. 01/23/20   [provider]  oxyCODONE (OXY IR/ROXICODONE) 5 MG immediate release tablet  01/24/18   [provider]  pantoprazole (PROTONIX) 40 MG tablet Take 40 mg by mouth daily. 12/19/17   [provider]  QUEtiapine (SEROQUEL) 200 MG tablet Take 200 mg by mouth at bedtime. 01/23/20   [provider]  ranitidine (ZANTAC) 150 MG tablet Take by mouth. 03/19/16   [provider]  risperiDONE (RISPERDAL) 0.25 MG tablet Take 0.25 mg by mouth at bedtime. 01/01/20   [provider]  sertraline (ZOLOFT) 25 MG tablet Take 25 mg by mouth daily. 02/12/20   [provider]  sertraline (ZOLOFT) 50 MG tablet Take 50 mg by mouth daily. 12/08/17   [provider]  traMADol Veatrice Bourbon) 50 MG tablet  01/24/18   [provider]  traZODone (DESYREL) 100 MG tablet Take 100 mg by mouth at bedtime. 01/28/20   [provider]  Physical Exam: Vitals:   05/21/20 1135 05/21/20 1457 05/21/20 1724 05/21/20 1924  BP: 101/72 126/72 105/62 (!) 141/69  Pulse: 90 72 70 73  Resp: 20 16 16 19   Temp: 98 F (36.7 C) (!) 97.4 F (36.3 C)    TempSrc: Oral Oral    SpO2: 99% 100% 98% 100%    Constitutional: NAD, calm, comfortable Vitals:   05/21/20 1135 05/21/20 1457 05/21/20 1724 05/21/20 1924  BP: 101/72 126/72 105/62 (!) 141/69   Pulse: 90 72 70 73  Resp: 20 16 16 19   Temp: 98 F (36.7 C) (!) 97.4 F (36.3 C)    TempSrc: Oral Oral    SpO2: 99% 100% 98% 100%   General: WDWN, Alert and oriented to self Eyes: EOMI, PERRL, conjunctivae normal.  Sclera nonicteric HENT:  [place]/AT, external ears normal.  Nares patent without epistasis.  Mucous membranes are moist. .  Neck: Soft, normal range of motion, supple, no masses, no thyromegaly. Trachea midline Respiratory: clear to auscultation bilaterally, no wheezing, no crackles. Normal respiratory effort. No accessory muscle use.  Cardiovascular: Regular rate and rhythm, no murmurs / rubs / gallops. No extremity edema. 2+ pedal pulses  Abdomen: Soft, no tenderness, nondistended, no rebound or guarding.  No masses palpated. Bowel sounds normoactive Musculoskeletal: FROM. no cyanosis. No joint deformity upper and lower extremities. Normal muscle tone.  Skin: Warm, dry, intact no rashes, lesions, ulcers. No induration Neurologic: CN 2-12 grossly intact.  Normal speech.  Sensation intact,  Strength 4/5 in all extremities.   Psychiatric:  Normal mood.  Pleasant but confused   Labs on Admission: I have personally reviewed following labs and imaging studies  CBC: Recent Labs  Lab 05/21/20 1117  WBC 9.5  HGB 12.4  HCT 40.8  MCV 97.6  PLT 202    Basic Metabolic Panel: Recent Labs  Lab 05/21/20 1117  NA 141  K 3.8  CL 105  CO2 24  GLUCOSE 93  BUN 27*  CREATININE 1.23*  CALCIUM 8.6*    GFR: CrCl cannot be calculated (Unknown ideal weight.).  Liver Function Tests: No results for input(s): AST, ALT, ALKPHOS, BILITOT, PROT, ALBUMIN in the last 168 hours.  Urine analysis: No results found for: COLORURINE, APPEARANCEUR, LABSPEC, [place], GLUCOSEU, [place], BILIRUBINUR, KETONESUR, PROTEINUR, UROBILINOGEN, NITRITE, LEUKOCYTESUR  Radiological Exams on Admission: CT HEAD WO CONTRAST  Result Date: 05/21/2020 CLINICAL DATA:  Mental status change. EXAM: CT HEAD WITHOUT  CONTRAST TECHNIQUE: Contiguous axial images were obtained from the base of the skull through the vertex without intravenous contrast. COMPARISON:  None. FINDINGS: Brain: Similar position of a right frontal ventriculostomy catheter with the tip at the right frontal horn. Similar hypoattenuation surrounding the shunt catheter. Visualized portions of the shunt catheter appear intact. The ventricular system is slightly more decompressed than on the prior without evidence of hydrocephalus. Similar encephalomalacia subjacent to a right parietal burr hole. No evidence of acute large vascular territory infarct. No acute hemorrhage. No mass lesion or abnormal mass effect. Basal cisterns are patent. Similar additional patchy white matter hypoattenuation, most likely related to chronic microvascular ischemia. Vascular: No hyperdense vessel identified. Calcific atherosclerosis. Skull: Right frontal and right parietal burr holes. No acute fracture. Sinuses/Orbits: Complete opacification of the visualized left maxillary sinus with internal hyperdensity. Unremarkable orbits. Other: No mastoid effusions. IMPRESSION: 1. Similar position of the right frontal ventriculostomy catheter with slightly more decompressed ventricular system and no evidence of hydrocephalus. Correlate with evidence of over shunting. 2. Otherwise, similar chronic findings without evidence  of acute intracranial abnormality. 3. Chronic left maxillary sinus disease with internal hyperdensity that may represent fungal colonization and/or inspissated secretions. Electronically Signed   By: Margaretha Sheffield MD   On: 05/21/2020 12:29   DG Chest Port 1 View  Result Date: 05/21/2020 CLINICAL DATA:  Syncopal episode EXAM: PORTABLE CHEST 1 VIEW COMPARISON:  04/20/2020 FINDINGS: The patient has taken a poor inspiration. Allowing for that, the heart and mediastinum are normal. The lungs are clear. The vascularity is normal. No effusions. Distant shoulder replacement on  the left. Chronic degenerative changes of the right shoulder. IMPRESSION: Poor inspiration. No active disease. Electronically Signed   By: Nelson Chimes M.D.   On: 05/21/2020 16:10    EKG: Independently reviewed. EKG shows NSR with nonspecific ST changes but no acute ST elevation or depression. QTc is 457  Assessment/Plan Principal Problem:   Syncope Ms. Flaim is admitted to medical telemetry floor.  She has had multiple syncopal episodes at the time after she lives in.  Unsure if she has had seizures with syncope is.  No family is at bedside. Orthostatic blood pressures were normal in the emergency room. Monitor on telemetry for arrhythmia that may be cause of syncope. Check MRI of brain Check BMP in the morning. Fall precautions.   Active Problems:   CKD (chronic kidney disease), stage III Fluid hydration with LR at 75 ml/hr overnight.  Check electrolytes renal function morning    Hypothyroidism Check TSH level.  Continue Synthroid and will adjust dose if indicated.    Dementia  . Continue Aricept and Namenda     DVT prophylaxis: Lovenox for DVT prophylaxis Code Status:   Full Code  Family Communication:  No family at bedside Disposition Plan:   Patient is from:  SNF  Anticipated DC to:  SNF  Anticipated DC date:  Anticipate less than two midnight stay for workup of acute condition  Anticipated DC barriers: No barriers to discharge identified at this time  Admission status:  Observation   Eben Burow MD [facility]  How to contact the [facility] Attending or Consulting provider [place] or covering provider during after hours [place], for this patient?   1. Check the care team in [facility] and look for a) attending/consulting [facility] provider listed and b) the [facility] team listed 2. Log into [email or web address] and use [place]'s universal password to access. If you do not have the password, please contact the hospital operator. 3. Locate the [facility] provider you are looking for  under [facility] and page to a number that you can be directly reached. 4. If you still have difficulty reaching the provider, please page the [facility] (Director on Call) for the Hospitalists listed on amion for assistance.  05/21/2020, 7:37 PM

## 2020-05-21 NOTE — ED Triage Notes (Signed)
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 Daughter also reports intermittent word salad is norma for her.

## 2020-05-21 NOTE — ED Notes (Signed)
Got patient on the monitor patient is resting with cal bell in reach and family at bedside

## 2020-05-22 ENCOUNTER — Observation Stay (HOSPITAL_COMMUNITY): Payer: Medicare Other

## 2020-05-22 ENCOUNTER — Observation Stay (HOSPITAL_BASED_OUTPATIENT_CLINIC_OR_DEPARTMENT_OTHER): Payer: Medicare Other

## 2020-05-22 ENCOUNTER — Inpatient Hospital Stay (HOSPITAL_COMMUNITY): Payer: Medicare Other

## 2020-05-22 DIAGNOSIS — E162 Hypoglycemia, unspecified: Secondary | ICD-10-CM | POA: Diagnosis present

## 2020-05-22 DIAGNOSIS — Z79899 Other long term (current) drug therapy: Secondary | ICD-10-CM | POA: Diagnosis not present

## 2020-05-22 DIAGNOSIS — I69320 Aphasia following cerebral infarction: Secondary | ICD-10-CM | POA: Diagnosis not present

## 2020-05-22 DIAGNOSIS — Z515 Encounter for palliative care: Secondary | ICD-10-CM | POA: Diagnosis not present

## 2020-05-22 DIAGNOSIS — Z79890 Hormone replacement therapy (postmenopausal): Secondary | ICD-10-CM | POA: Diagnosis not present

## 2020-05-22 DIAGNOSIS — K219 Gastro-esophageal reflux disease without esophagitis: Secondary | ICD-10-CM | POA: Diagnosis present

## 2020-05-22 DIAGNOSIS — I6523 Occlusion and stenosis of bilateral carotid arteries: Secondary | ICD-10-CM | POA: Diagnosis present

## 2020-05-22 DIAGNOSIS — E860 Dehydration: Secondary | ICD-10-CM | POA: Diagnosis present

## 2020-05-22 DIAGNOSIS — Z20822 Contact with and (suspected) exposure to covid-19: Secondary | ICD-10-CM | POA: Diagnosis present

## 2020-05-22 DIAGNOSIS — G309 Alzheimer's disease, unspecified: Secondary | ICD-10-CM | POA: Diagnosis present

## 2020-05-22 DIAGNOSIS — Z982 Presence of cerebrospinal fluid drainage device: Secondary | ICD-10-CM | POA: Diagnosis not present

## 2020-05-22 DIAGNOSIS — E039 Hypothyroidism, unspecified: Secondary | ICD-10-CM | POA: Diagnosis present

## 2020-05-22 DIAGNOSIS — R627 Adult failure to thrive: Secondary | ICD-10-CM | POA: Diagnosis present

## 2020-05-22 DIAGNOSIS — Z885 Allergy status to narcotic agent status: Secondary | ICD-10-CM | POA: Diagnosis not present

## 2020-05-22 DIAGNOSIS — Z7189 Other specified counseling: Secondary | ICD-10-CM | POA: Diagnosis not present

## 2020-05-22 DIAGNOSIS — F0281 Dementia in other diseases classified elsewhere with behavioral disturbance: Secondary | ICD-10-CM | POA: Diagnosis present

## 2020-05-22 DIAGNOSIS — R55 Syncope and collapse: Secondary | ICD-10-CM

## 2020-05-22 DIAGNOSIS — N179 Acute kidney failure, unspecified: Secondary | ICD-10-CM | POA: Diagnosis present

## 2020-05-22 DIAGNOSIS — G3183 Dementia with Lewy bodies: Secondary | ICD-10-CM | POA: Diagnosis present

## 2020-05-22 DIAGNOSIS — G9341 Metabolic encephalopathy: Secondary | ICD-10-CM | POA: Diagnosis present

## 2020-05-22 DIAGNOSIS — Z66 Do not resuscitate: Secondary | ICD-10-CM | POA: Diagnosis not present

## 2020-05-22 DIAGNOSIS — Z781 Physical restraint status: Secondary | ICD-10-CM | POA: Diagnosis not present

## 2020-05-22 DIAGNOSIS — Z9181 History of falling: Secondary | ICD-10-CM | POA: Diagnosis not present

## 2020-05-22 DIAGNOSIS — N1831 Chronic kidney disease, stage 3a: Secondary | ICD-10-CM | POA: Diagnosis present

## 2020-05-22 LAB — ECHOCARDIOGRAM COMPLETE
Area-P 1/2: 2.8 cm2
S' Lateral: 2.1 cm

## 2020-05-22 LAB — BASIC METABOLIC PANEL
Anion gap: 11 (ref 5–15)
BUN: 23 mg/dL (ref 8–23)
CO2: 20 mmol/L — ABNORMAL LOW (ref 22–32)
Calcium: 8.4 mg/dL — ABNORMAL LOW (ref 8.9–10.3)
Chloride: 106 mmol/L (ref 98–111)
Creatinine, Ser: 1.05 mg/dL — ABNORMAL HIGH (ref 0.44–1.00)
GFR, Estimated: 56 mL/min — ABNORMAL LOW (ref >60)
Glucose, Bld: 68 mg/dL — ABNORMAL LOW (ref 70–99)
Potassium: 4.7 mmol/L (ref 3.5–5.1)
Sodium: 137 mmol/L (ref 135–145)

## 2020-05-22 LAB — SARS CORONAVIRUS 2 (TAT 6-24 HRS): SARS Coronavirus 2: NEGATIVE

## 2020-05-22 LAB — CBG MONITORING, ED: Glucose-Capillary: 74 mg/dL (ref 70–99)

## 2020-05-22 MED ORDER — TRAZODONE HCL 50 MG PO TABS
50.0000 mg | ORAL_TABLET | Freq: Every day | ORAL | Status: DC
  Administered 2020-05-22: 50 mg via ORAL
  Filled 2020-05-22 (×2): qty 1

## 2020-05-22 MED ORDER — HALOPERIDOL LACTATE 5 MG/ML IJ SOLN
2.0000 mg | Freq: Four times a day (QID) | INTRAMUSCULAR | Status: DC | PRN
Start: 2020-05-22 — End: 2020-05-24

## 2020-05-22 NOTE — ED Notes (Signed)
Pt continues resting, will obtain vitals when pt wakes up

## 2020-05-22 NOTE — Progress Notes (Signed)
Carotid US completed    Please see CV Proc for preliminary results.   Nolita Kutter, RVT  

## 2020-05-22 NOTE — Progress Notes (Signed)
Attempted EEG. Pt was not in her room. Will try later when our schedule permits

## 2020-05-22 NOTE — Progress Notes (Signed)
EEG Completed; Results Pending  

## 2020-05-22 NOTE — Progress Notes (Signed)
Cross-coverage note:   Patient has been agitated this evening, pulling lines and now hitting the sitter. Plan to use soft restraints for now, give low-dose Haldol, continue frequent reassessments, and discontinue restraints as soon as possible.

## 2020-05-22 NOTE — Evaluation (Signed)
Physical Therapy Evaluation Patient Details Name: Renee Raymond MRN: [id number] DOB: [birth year] Today's Date: 05/22/2020   History of Present Illness  Pt is a 75 y/o female admitted from SNF secondary to multiple syncopal episodes. PMH includes dementia and CKD.  Clinical Impression  Pt admitted secondary to problem above with deficits below. Pt requiring min A to stand and transfer to/from [facility]. Pt impulsive and confused throughout. Per notes, pt from SNF and recommend return at d/c. Will continue to follow acutely.     Follow Up Recommendations SNF;Supervision/Assistance - 24 hour    Equipment Recommendations  None recommended by PT    Recommendations for Other Services       Precautions / Restrictions Precautions Precautions: Fall Restrictions Weight Bearing Restrictions: No      Mobility  Bed Mobility Overal bed mobility: Needs Assistance Bed Mobility: Supine to Sit;Sit to Supine     Supine to sit: Min assist Sit to supine: Min assist   General bed mobility comments: Assist required for trunk and LE assist. Pt slightly impulsive.    Transfers Overall transfer level: Needs assistance Equipment used: 1 person hand held assist Transfers: Sit to/from [facility] Sit to Stand: Min assist Stand pivot transfers: Min assist       General transfer comment: Min A for steadying to stand and transfer to/from [facility]. Pt asking "do you have one of those bladder things", but unsuccessful in going to bathroom. Notified RN and NT.  Ambulation/Gait                Stairs            Wheelchair Mobility    Modified Rankin (Stroke Patients Only)       Balance Overall balance assessment: Needs assistance Sitting-balance support: No upper extremity supported;Feet supported Sitting balance-Leahy Scale: Fair     Standing balance support: Single extremity supported Standing balance-Leahy Scale: Poor Standing balance comment: Reliant on at  least 1 UE and external support                             Pertinent Vitals/Pain Pain Assessment: Faces Faces Pain Scale: No hurt    Home Living Family/patient expects to be discharged to:: Skilled nursing facility                      Prior Function           Comments: Unsure of PLOF, but assume staff had to assist with mobility/ADL tasks.     Hand Dominance        Extremity/Trunk Assessment   Upper Extremity Assessment Upper Extremity Assessment: Defer to OT evaluation    Lower Extremity Assessment Lower Extremity Assessment: Generalized weakness    Cervical / Trunk Assessment Cervical / Trunk Assessment: Kyphotic  Communication   Communication: HOH;Expressive difficulties  Cognition Arousal/Alertness: Awake/alert Behavior During Therapy: Flat affect;Impulsive Overall Cognitive Status: No family/caregiver present to determine baseline cognitive functioning                                 General Comments: [place] at baseline. Very confused throughout and unable to answer any questions appropriately. Impulsive with mobility tasks.      General Comments      Exercises     Assessment/Plan    PT Assessment Patient needs continued PT services  PT Problem List Decreased  strength;Decreased balance;Decreased mobility;Decreased cognition;Decreased knowledge of use of DME;Decreased safety awareness;Decreased knowledge of precautions       PT Treatment Interventions Gait training;DME instruction;Therapeutic exercise;Therapeutic activities;Functional mobility training;Balance training;Cognitive remediation;Patient/family education    PT Goals (Current goals can be found in the Care Plan section)  Acute Rehab PT Goals PT Goal Formulation: Patient unable to participate in goal setting Time For Goal Achievement: 06/05/20 Potential to Achieve Goals: Fair    Frequency Min 2X/week   Barriers to discharge         Co-evaluation               AM-PAC PT "6 Clicks" Mobility  Outcome Measure Help needed turning from your back to your side while in a flat bed without using bedrails?: A Little Help needed moving from lying on your back to sitting on the side of a flat bed without using bedrails?: A Little Help needed moving to and from a bed to a chair (including a wheelchair)?: A Little Help needed standing up from a chair using your arms (e.g., wheelchair or bedside chair)?: A Little Help needed to walk in hospital room?: A Lot Help needed climbing 3-5 steps with a railing? : Total 6 Click Score: 15    End of Session Equipment Utilized During Treatment: Gait belt Activity Tolerance: Patient tolerated treatment well Patient left: in bed;with call bell/phone within reach;with nursing/sitter in room (on bed in ED. Sitter present) Nurse Communication: Mobility status PT Visit Diagnosis: Unsteadiness on feet (R26.81);Muscle weakness (generalized) (M62.81)    Time: 1012-1027 PT Time Calculation (min) (ACUTE ONLY): 15 min   Charges:   PT Evaluation $PT Eval Moderate Complexity: 1 Mod          Reuel Derby, PT, DPT  Acute Rehabilitation Services  Pager: [phone] Office: [phone]   Rudean Hitt 05/22/2020, 1:50 PM

## 2020-05-22 NOTE — Progress Notes (Signed)
PROGRESS NOTE    Renee Raymond  WUX:[id number] DOB: 12-02-1945 DOA: 05/21/2020 PCP: Ronita Hipps, MD   Chief Complaint  Patient presents with  . Loss of Consciousness    Brief Narrative:  75 year old lady from SNF, with prior h/o dementia, Stage 3 a CKD, hypothyroidism, presents to ED after multiple syncopal episodes.  She was admitted for further stroke work up.   Assessment & Plan:   Principal Problem:   Syncope Active Problems:   Dementia ([place])   CKD (chronic kidney disease), stage III (HCC)   Hypothyroidism   Syncope:  Probably secondary to dehydration, differential include arrhythmias vs seizures vs orthostatic hypotension.  Orthostatic vital signs normal.  EEG ordered and pending.  Echocardiogram showed preserved LVEF and no wall motion abn.  EKG does not show any heart blocks.  Telemetry monitoring overnight to look for arrhythmias.     Mild acute on stage 3a CKD: Creatinine improved with Hydration.     Hypothyroidism:  Resume synthroid.    Dementia:  Continue with aricept. And namenda.    DVT prophylaxis: (Lovenox) Code Status: (Full code) Family Communication: discussed with husband at bedside.  Disposition:   Status is: Observation  The patient will require care spanning > 2 midnights and should be moved to inpatient because: Ongoing diagnostic testing needed not appropriate for outpatient work up  Dispo: The patient is from: SNF              Anticipated d/c is to: SNF              Anticipated d/c date is: 3 days              Patient currently is not medically stable to d/c.   Difficult to place patient No       Level of care: Telemetry Medical Consultants:   None.    Procedures:   Echocardiogram.  EEG   Antimicrobials:   None.    Subjective: Pt confused, denies any new complaints.   Objective: Vitals:   05/21/20 1924 05/22/20 0645 05/22/20 0802 05/22/20 1230  BP: (!) 141/69 115/74 98/78 (!) 123/56  Pulse:  73 75 76 77  Resp: 19 16 18  (!) 22  Temp:   98.8 F (37.1 C) 98.3 F (36.8 C)  TempSrc:   Oral   SpO2: 100% 98% 97% 98%    Intake/Output Summary (Last 24 hours) at 05/22/2020 1538 Last data filed at 05/22/2020 1347 Gross per 24 hour  Intake 360 ml  Output -  Net 360 ml   There were no vitals filed for this visit.  Examination:  General exam: Appears calm and comfortable  Respiratory system: Clear to auscultation. Respiratory effort normal. Cardiovascular system: S1 & S2 heard, RRR. No JVD,  Gastrointestinal system: Abdomen is nondistended, soft and nontender. Normal bowel sounds heard. Central nervous system: confused, able to move all extremities.  Extremities: Symmetric 5 x 5 power. Skin: No rashes, lesions or ulcers Psychiatry: confused.     Data Reviewed: I have personally reviewed following labs and imaging studies  CBC: Recent Labs  Lab 05/21/20 1117 05/21/20 1957  WBC 9.5 10.2  HGB 12.4 13.0  HCT 40.8 42.1  MCV 97.6 96.6  PLT 248 253    Basic Metabolic Panel: Recent Labs  Lab 05/21/20 1117 05/21/20 1957 05/22/20 0645  NA 141  --  137  K 3.8  --  4.7  CL 105  --  106  CO2 24  --  20*  GLUCOSE 93  --  68*  BUN 27*  --  23  CREATININE 1.23* 1.06* 1.05*  CALCIUM 8.6*  --  8.4*    GFR: CrCl cannot be calculated (Unknown ideal weight.).  Liver Function Tests: No results for input(s): AST, ALT, ALKPHOS, BILITOT, PROT, ALBUMIN in the last 168 hours.  CBG: Recent Labs  Lab 05/21/20 1123 05/22/20 0808  GLUCAP 88 74     Recent Results (from the past 240 hour(s))  SARS CORONAVIRUS 2 (TAT 6-24 HRS) Nasopharyngeal Urine, Clean Catch     Status: None   Collection Time: 05/21/20  7:57 PM   Specimen: Urine, Clean Catch; Nasopharyngeal  Result Value Ref Range Status   SARS Coronavirus 2 NEGATIVE NEGATIVE Final    Comment: (NOTE) SARS-CoV-2 target nucleic acids are NOT DETECTED.  The SARS-CoV-2 RNA is generally detectable in upper and  lower respiratory specimens during the acute phase of infection. Negative results do not preclude SARS-CoV-2 infection, do not rule out co-infections with other pathogens, and should not be used as the sole basis for treatment or other patient management decisions. Negative results must be combined with clinical observations, patient history, and epidemiological information. The expected result is Negative.  Fact Sheet for Patients: SugarRoll.be  Fact Sheet for Healthcare Providers: https://www.woods-mathews.com/  This test is not yet approved or cleared by the [place] FDA and  has been authorized for detection and/or diagnosis of SARS-CoV-2 by FDA under an Emergency Use Authorization (EUA). This EUA will remain  in effect (meaning this test can be used) for the duration of the COVID-19 declaration under Se ction 564(b)(1) of the Act, 21 U.S.C. section 360bbb-3(b)(1), unless the authorization is terminated or revoked sooner.  Performed at [facility], [place] [address], [place], [place] [address]          Radiology Studies: CT HEAD WO CONTRAST  Result Date: 05/21/2020 CLINICAL DATA:  Mental status change. EXAM: CT HEAD WITHOUT CONTRAST TECHNIQUE: Contiguous axial images were obtained from the base of the skull through the vertex without intravenous contrast. COMPARISON:  None. FINDINGS: Brain: Similar position of a right frontal ventriculostomy catheter with the tip at the right frontal horn. Similar hypoattenuation surrounding the shunt catheter. Visualized portions of the shunt catheter appear intact. The ventricular system is slightly more decompressed than on the prior without evidence of hydrocephalus. Similar encephalomalacia subjacent to a right parietal burr hole. No evidence of acute large vascular territory infarct. No acute hemorrhage. No mass lesion or abnormal mass effect. Basal cisterns are patent. Similar additional  patchy white matter hypoattenuation, most likely related to chronic microvascular ischemia. Vascular: No hyperdense vessel identified. Calcific atherosclerosis. Skull: Right frontal and right parietal burr holes. No acute fracture. Sinuses/Orbits: Complete opacification of the visualized left maxillary sinus with internal hyperdensity. Unremarkable orbits. Other: No mastoid effusions. IMPRESSION: 1. Similar position of the right frontal ventriculostomy catheter with slightly more decompressed ventricular system and no evidence of hydrocephalus. Correlate with evidence of over shunting. 2. Otherwise, similar chronic findings without evidence of acute intracranial abnormality. 3. Chronic left maxillary sinus disease with internal hyperdensity that may represent fungal colonization and/or inspissated secretions. Electronically Signed   By: Margaretha Sheffield MD   On: 05/21/2020 12:29   DG Chest Port 1 View  Result Date: 05/21/2020 CLINICAL DATA:  Syncopal episode EXAM: PORTABLE CHEST 1 VIEW COMPARISON:  04/20/2020 FINDINGS: The patient has taken a poor inspiration. Allowing for that, the heart and mediastinum are normal. The lungs are  clear. The vascularity is normal. No effusions. Distant shoulder replacement on the left. Chronic degenerative changes of the right shoulder. IMPRESSION: Poor inspiration. No active disease. Electronically Signed   By: Nelson Chimes M.D.   On: 05/21/2020 16:10   ECHOCARDIOGRAM COMPLETE  Result Date: 05/22/2020    ECHOCARDIOGRAM REPORT   Patient Name:   ALYSCIA CARMON Date of Exam: 05/22/2020 Medical Rec #:  [id number]            Height:       57.0 in Accession #:    [id number]           Weight:       139.1 lb Date of Birth:  Feb 09, 1946            BSA:          1.541 m Patient Age:    82 years             BP:           98/78 mmHg Patient Gender: F                    HR:           73 bpm. Exam Location:  Inpatient Procedure: 2D Echo, Color Doppler and Cardiac Doppler Indications:     Syncope 780.2 / R55  History:        Patient has no prior history of Echocardiogram examinations.                 Signs/Symptoms:Syncope.  Sonographer:    Bernadene Person RDCS Referring Phys: [id number] [place]  1. Left ventricular ejection fraction, by estimation, is 60 to 65%. The left ventricle has normal function. The left ventricle has no regional wall motion abnormalities. Left ventricular diastolic parameters are indeterminate.  2. Right ventricular systolic function is normal. The right ventricular size is normal. There is normal pulmonary artery systolic pressure.  3. Left atrial size was mildly dilated.  4. The mitral valve is normal in structure. Trivial mitral valve regurgitation.  5. The aortic valve is tricuspid. Aortic valve regurgitation is trivial.  6. The inferior vena cava is normal in size with greater than 50% respiratory variability, suggesting right atrial pressure of 3 mmHg. Comparison(s): No prior Echocardiogram. Conclusion(s)/Recommendation(s): Otherwise normal echocardiogram, with minor abnormalities described in the report. FINDINGS  Left Ventricle: Left ventricular ejection fraction, by estimation, is 60 to 65%. The left ventricle has normal function. The left ventricle has no regional wall motion abnormalities. The left ventricular internal cavity size was normal in size. There is  no left ventricular hypertrophy. Left ventricular diastolic parameters are indeterminate. Right Ventricle: The right ventricular size is normal. Right vetricular wall thickness was not well visualized. Right ventricular systolic function is normal. There is normal pulmonary artery systolic pressure. The tricuspid regurgitant velocity is 2.20 m/s, and with an assumed right atrial pressure of 3 mmHg, the estimated right ventricular systolic pressure is 23.5 mmHg. Left Atrium: Left atrial size was mildly dilated. Right Atrium: Right atrial size was normal in size. Pericardium: There is no  evidence of pericardial effusion. Presence of pericardial fat pad. Mitral Valve: The mitral valve is normal in structure. Trivial mitral valve regurgitation. Tricuspid Valve: The tricuspid valve is normal in structure. Tricuspid valve regurgitation is trivial. No evidence of tricuspid stenosis. Aortic Valve: The aortic valve is tricuspid. Aortic valve regurgitation is trivial. Pulmonic Valve: The pulmonic valve was grossly normal. Pulmonic valve regurgitation is  not visualized. No evidence of pulmonic stenosis. Aorta: The aortic root, ascending aorta and aortic arch are all structurally normal, with no evidence of dilitation or obstruction. Venous: The inferior vena cava is normal in size with greater than 50% respiratory variability, suggesting right atrial pressure of 3 mmHg. IAS/Shunts: The atrial septum is grossly normal.  LEFT VENTRICLE PLAX 2D LVIDd:         3.60 cm  Diastology LVIDs:         2.10 cm  LV e' medial:    6.31 cm/s LV PW:         0.80 cm  LV E/e' medial:  9.6 LV IVS:        0.60 cm  LV e' lateral:   8.81 cm/s LVOT diam:     1.70 cm  LV E/e' lateral: 6.8 LV SV:         57 LV SV Index:   37 LVOT Area:     2.27 cm  RIGHT VENTRICLE RV S prime:     12.50 cm/s TAPSE (M-mode): 1.0 cm LEFT ATRIUM           Index       RIGHT ATRIUM           Index LA diam:      2.50 cm 1.62 cm/m  RA Area:     11.20 cm LA Vol (A2C): 14.6 ml 9.47 ml/m  RA Volume:   24.50 ml  15.89 ml/m LA Vol (A4C): 45.5 ml 29.52 ml/m  AORTIC VALVE LVOT Vmax:   118.00 cm/s LVOT Vmean:  84.000 cm/s LVOT VTI:    0.253 m  AORTA Ao Root diam: 2.80 cm Ao Asc diam:  3.00 cm MITRAL VALVE               TRICUSPID VALVE MV Area (PHT): 2.80 cm    TR Peak grad:   19.4 mmHg MV Decel Time: 271 msec    TR Vmax:        220.00 cm/s MV E velocity: 60.30 cm/s MV A velocity: 64.40 cm/s  SHUNTS MV E/A ratio:  0.94        Systemic VTI:  0.25 m                            Systemic Diam: 1.70 cm Buford Dresser MD Electronically signed by Buford Dresser MD Signature Date/Time: 05/22/2020/1:33:40 PM    Final    VAS US CAROTID  Result Date: 05/22/2020 Carotid Arterial Duplex Study Indications:       Syncope. Risk Factors:      No history of smoking. Other Factors:     Dementia. Comparison Study:  No previous Performing Technologist: Vonzell Schlatter RVT  Examination Guidelines: A complete evaluation includes B-mode imaging, spectral Doppler, color Doppler, and power Doppler as needed of all accessible portions of each vessel. Bilateral testing is considered an integral part of a complete examination. Limited examinations for reoccurring indications may be performed as noted.  Right Carotid Findings: +----------+--------+--------+--------+------------------+--------+           PSV cm/sEDV cm/sStenosisPlaque DescriptionComments +----------+--------+--------+--------+------------------+--------+ CCA Prox  76      23                                         +----------+--------+--------+--------+------------------+--------+ CCA Distal80      23                                         +----------+--------+--------+--------+------------------+--------+  ICA Prox  38      11      1-39%   heterogenous               +----------+--------+--------+--------+------------------+--------+ ICA Distal55      22                                         +----------+--------+--------+--------+------------------+--------+ ECA       76      15                                         +----------+--------+--------+--------+------------------+--------+ +----------+--------+-------+--------+-------------------+           PSV cm/sEDV cmsDescribeArm Pressure (mmHG) +----------+--------+-------+--------+-------------------+ [id number]                                         +----------+--------+-------+--------+-------------------+ +---------+--------+--+--------+--+ VertebralPSV cm/s33EDV cm/s10  +---------+--------+--+--------+--+  Left Carotid Findings: +----------+--------+--------+--------+------------------+--------+           PSV cm/sEDV cm/sStenosisPlaque DescriptionComments +----------+--------+--------+--------+------------------+--------+ CCA Prox  71      14                                         +----------+--------+--------+--------+------------------+--------+ CCA Distal53      15                                         +----------+--------+--------+--------+------------------+--------+ ICA Prox  45      14      1-39%   heterogenous               +----------+--------+--------+--------+------------------+--------+ ICA Distal54      17                                         +----------+--------+--------+--------+------------------+--------+ ECA       77      13                                         +----------+--------+--------+--------+------------------+--------+ +----------+--------+--------+--------+-------------------+           PSV cm/sEDV cm/sDescribeArm Pressure (mmHG) +----------+--------+--------+--------+-------------------+ [id number]                                          +----------+--------+--------+--------+-------------------+ +---------+--------+--+--------+--+ VertebralPSV cm/s38EDV cm/s11 +---------+--------+--+--------+--+   Summary: Right Carotid: Velocities in the right ICA are consistent with a 1-39% stenosis. Left Carotid: Velocities in the left ICA are consistent with a 1-39% stenosis. Vertebrals:  Bilateral vertebral arteries demonstrate antegrade flow. Subclavians: Normal flow hemodynamics were seen in bilateral subclavian              arteries. *See table(s) above for measurements and observations.  Electronically signed by Servando Snare MD  on 05/22/2020 at 1:00:16 PM.    Final         Scheduled Meds: . donepezil  10 mg Oral QHS  . enoxaparin (LOVENOX) injection  40 mg Subcutaneous Q24H  .  levothyroxine  25 mcg Oral Q0600  . memantine  10 mg Oral Daily  . sertraline  25 mg Oral Daily  . sodium chloride flush  3 mL Intravenous Q12H  . traZODone  50 mg Oral QHS   Continuous Infusions: . lactated ringers Stopped (05/21/20 2145)     LOS: 0 days       Hosie Poisson, MD [facility]   To contact the attending provider between 7A-7P or the covering provider during after hours 7P-7A, please log into the web site [email or web address] and access using universal [place] password for that web site. If you do not have the password, please call the hospital operator.  05/22/2020, 3:38 PM

## 2020-05-22 NOTE — ED Notes (Signed)
Pt continues resting, chest rise and fall visible

## 2020-05-22 NOTE — ED Notes (Signed)
Breakfast Ordered 

## 2020-05-23 ENCOUNTER — Other Ambulatory Visit: Payer: Self-pay

## 2020-05-23 ENCOUNTER — Encounter (HOSPITAL_COMMUNITY): Payer: Self-pay | Admitting: Internal Medicine

## 2020-05-23 DIAGNOSIS — R55 Syncope and collapse: Secondary | ICD-10-CM | POA: Diagnosis not present

## 2020-05-23 DIAGNOSIS — N1831 Chronic kidney disease, stage 3a: Secondary | ICD-10-CM | POA: Diagnosis not present

## 2020-05-23 HISTORY — DX: Syncope and collapse: R55

## 2020-05-23 LAB — BASIC METABOLIC PANEL
Anion gap: 15 (ref 5–15)
BUN: 20 mg/dL (ref 8–23)
CO2: 16 mmol/L — ABNORMAL LOW (ref 22–32)
Calcium: 8.6 mg/dL — ABNORMAL LOW (ref 8.9–10.3)
Chloride: 108 mmol/L (ref 98–111)
Creatinine, Ser: 1.11 mg/dL — ABNORMAL HIGH (ref 0.44–1.00)
GFR, Estimated: 53 mL/min — ABNORMAL LOW (ref >60)
Glucose, Bld: 69 mg/dL — ABNORMAL LOW (ref 70–99)
Potassium: 4.5 mmol/L (ref 3.5–5.1)
Sodium: 139 mmol/L (ref 135–145)

## 2020-05-23 LAB — CBG MONITORING, ED
Glucose-Capillary: 81 mg/dL (ref 70–99)
Glucose-Capillary: 46 mg/dL — ABNORMAL LOW (ref 70–99)
Glucose-Capillary: 133 mg/dL — ABNORMAL HIGH (ref 70–99)

## 2020-05-23 LAB — GLUCOSE, CAPILLARY
Glucose-Capillary: 92 mg/dL (ref 70–99)
Glucose-Capillary: 128 mg/dL — ABNORMAL HIGH (ref 70–99)

## 2020-05-23 LAB — HEMOGLOBIN A1C
Hgb A1c MFr Bld: 5.4 % (ref 4.8–5.6)
Mean Plasma Glucose: 108.28 mg/dL

## 2020-05-23 LAB — MAGNESIUM: Magnesium: 2.1 mg/dL (ref 1.7–2.4)

## 2020-05-23 MED ORDER — DEXTROSE 50 % IV SOLN: 1.0000 | Freq: Once | INTRAVENOUS | Status: AC

## 2020-05-23 MED ORDER — DEXTROSE-NACL 5-0.9 % IV SOLN: INTRAVENOUS | Status: DC

## 2020-05-23 MED ORDER — DEXTROSE 50 % IV SOLN
INTRAVENOUS | Status: AC
  Administered 2020-05-23: 50 mL
  Filled 2020-05-23: qty 50

## 2020-05-23 NOTE — Progress Notes (Signed)
Patient's daughter Piedad Climes was at the bedside during shift change, she let us know that her mother's code status is DNR, she came with the yellow paper in ED per daughter. Night provider MD Opyd made aware and changed code status.

## 2020-05-23 NOTE — ED Notes (Signed)
Pt refusing to eat or drink at this time Not appropriatly responding to questions.

## 2020-05-23 NOTE — Consult Note (Signed)
Cardiology Consultation:   Patient ID: Renee Raymond MRN: [id number]; DOB: 1945/12/23  Admit date: 05/21/2020 Date of Consult: 05/23/2020  PCP:  Ronita Hipps, MD   E. Lopez  Cardiologist:  Minus Breeding, MD [id number]}    Patient Profile:   Renee Raymond is a 75 y.o. female with a hx of Alzheimer's dementia, VP shunt, GERD, and CKD stage III who is being seen today for the evaluation of recurrent syncope at the request of Dr. Karleen Hampshire.  History of Present Illness:   Renee Raymond lives at [facility] and walks independently at baseline. She is verbal, but communication is often complicated by "word salad." Pt BIB EMS after 2 witnessed syncopal events. Pt is currently not answering questions, refusing to eat or drink, Level 5 Caveat. History is gathered from chart.   She has a history of falls - was seen in the ER 02/14/20 and 04/04/20 for falls. No trauma, no admission. She was seen in the ER 04/20/20 for chest pain - pt was sweeping after breakfast and noted to clutch her chest. She ruled out in the ER and was not admitted for any additional workup.   She had a syncopal episode today at her memory care nursing home. Per report from staff, she was walking back from breakfast and noted that she began falling. She was assisted to the ground without injury and lost consciousness for about 1 minute. She had a second episode of LOC for about a minute. Daughter does report that the facility staff have called her for multiple unwitnessed falls over the past several weeks. Unclear if these were accompanied by LOC. Daughter also reports that a malfunction of her VP shunt in 2001 presented with a syncopal episode.   Was not orthostatic in the ER. MRI brain has not been completed.   Cardiology asked to evaluate. Neurology has also been consulted. EEG completed does show encephalopathy but no evidence of seizure activity.   Unfortunately, the patient has  suffered sundowning with aggressions toward staff and sitter requiring soft restraints at times. As such, there have been disruptions in telemetry monitoring.  Carotid ultrasound with mild nonobstructive carotid artery stenosis bilaterally. Echocardiogram shows normal EF, trivial MR, trivial AR.    Hb 13 PLT 294 WBC 10.2 CO2 20 sCr 1.05 K 4.7 HS troponin x 2 negative TSH WNL UA negative for UTI  CXR no acute abnormalities  Hypoglycemic this afternoon - 46 Pt is refusing to eat or drink.  Daughter is at bedside and helps with history. She states the patient hasn't complained of chest pain or dyspnea. She does note some subtle changes in her dementia - words are no longer enunciated and she seems "empty."   Past Medical History:  Diagnosis Date  . CKD (chronic kidney disease) stage 3, GFR 30-59 ml/min (HCC)   . Dementia ([place])   . GERD (gastroesophageal reflux disease)   . S/P VP shunt    hx of obstruction in 2001 associated with syncope    Past Surgical History:  Procedure Laterality Date  . LUMBAR FUSION       Home Medications:  Prior to Admission medications   Medication Sig Start Date End Date Taking? Authorizing Provider  acetaminophen (TYLENOL) 325 MG tablet Take 650 mg by mouth every 6 (six) hours as needed for mild pain.   Yes [provider]  busPIRone (BUSPAR) 10 MG tablet Take 10 mg by mouth 3 (three) times daily. 01/23/20  Yes [provider]  dexamethasone (DECADRON) 0.75 MG tablet Take 0.75 mg by mouth 2 (two) times daily. 01/23/20  Yes [provider]  divalproex (DEPAKOTE) 125 MG DR tablet Take 125 mg by mouth 3 (three) times daily. 01/29/20  Yes [provider]  docusate sodium (COLACE) 100 MG capsule Take 100 mg by mouth 2 (two) times daily.   Yes [provider]  Ensure Plus (ENSURE PLUS) LIQD Take 237 mLs by mouth 3 (three) times daily between meals.   Yes [provider]  Lactobacillus TABS Take 1  tablet by mouth 3 (three) times daily.   Yes [provider]  LORazepam (ATIVAN) 0.5 MG tablet Take 0.5 mg by mouth See admin instructions. Takes 1 tablet (0.5 mg totally) by mouth 2 times daily; takes extra 1 tablet (0.5 mg totally) by mouth as needed for anxiety/aggression 12/26/19  Yes [provider]  melatonin 1 MG TABS tablet Take 1 mg by mouth at bedtime. 03/23/19  Yes [provider]  mirtazapine (REMERON) 7.5 MG tablet Take 7.5 mg by mouth at bedtime. 01/23/20  Yes [provider]  pantoprazole (PROTONIX) 40 MG tablet Take 40 mg by mouth daily. 12/19/17  Yes [provider]  QUEtiapine (SEROQUEL) 200 MG tablet Take 200 mg by mouth at bedtime. 01/23/20  Yes [provider]  QUEtiapine (SEROQUEL) 50 MG tablet Take 50 mg by mouth every morning.   Yes [provider]  sertraline (ZOLOFT) 25 MG tablet Take 25 mg by mouth daily. 02/12/20  Yes [provider]  traZODone (DESYREL) 100 MG tablet Take 100 mg by mouth at bedtime. 01/28/20  Yes [provider]    Inpatient Medications: Scheduled Meds: . donepezil  10 mg Oral QHS  . enoxaparin (LOVENOX) injection  40 mg Subcutaneous Q24H  . levothyroxine  25 mcg Oral Q0600  . memantine  10 mg Oral Daily  . sertraline  25 mg Oral Daily  . sodium chloride flush  3 mL Intravenous Q12H  . traZODone  50 mg Oral QHS   Continuous Infusions: . dextrose 5 % and 0.9% NaCl 75 mL/hr at 05/23/20 1355   PRN Meds: acetaminophen **OR** acetaminophen, haloperidol lactate, senna-docusate  Allergies:    Allergies  Allergen Reactions  . Codeine Other (See Comments)    GI upset    Social History:   Social History   Socioeconomic History  . Marital status: Married    Spouse name: Not on file  . Number of children: Not on file  . Years of education: Not on file  . Highest education level: Not on file  Occupational History  . Not on file  Tobacco Use  . Smoking status:  Never Smoker  . Smokeless tobacco: Never Used  Vaping Use  . Vaping Use: Never used  Substance and Sexual Activity  . Alcohol use: Not Currently  . Drug use: Not Currently  . Sexual activity: Not Currently  Other Topics Concern  . Not on file  Social History Narrative  . Not on file   Social Determinants of Health   Financial Resource Strain: Not on file  Food Insecurity: Not on file  Transportation Needs: Not on file  Physical Activity: Not on file  Stress: Not on file  Social Connections: Not on file  Intimate Partner Violence: Not on file    Family History:   History reviewed. No pertinent family history.   ROS:  Please see the history of present illness.   All other ROS reviewed  and negative.     Physical Exam/Data:   Vitals:   05/23/20 0600 05/23/20 0915 05/23/20 1200 05/23/20 1300  BP: (!) 103/54 113/78 127/77 118/77  Pulse: 83 74 81 89  Resp: (!) 21 19 (!) 22 (!) 23  Temp: 99 F (37.2 C)     TempSrc: Oral     SpO2: 95% 100% 100% 97%    Intake/Output Summary (Last 24 hours) at 05/23/2020 1409 Last data filed at 05/22/2020 1822 Gross per 24 hour  Intake 120 ml  Output --  Net 120 ml   Last 3 Weights 04/20/2020 02/14/2020 04/27/2018  Weight (lbs) 139 lb 1.6 oz 115 lb 15.4 oz 116 lb  Weight (kg) 63.095 kg 52.6 kg 52.617 kg     There is no height or weight on file to calculate BMI.  General:  Elderly female, soft restraints, resting comfortably Neck: no JVD Vascular: No carotid bruits  Cardiac:  normal S1, S2; RRR; no murmur  Lungs:  clear to auscultation bilaterally, no wheezing, rhonchi or rales  Abd: soft, nontender, no hepatomegaly  Ext: no edema Skin: warm and dry  Neuro:  resting Psych:  Normal affect   EKG:  The EKG was personally reviewed and demonstrates:  Sinus rhythm HR 86, poor R wave progression Telemetry:  Telemetry was personally reviewed and demonstrates:  Sinus rhythm in the 70-80s  Relevant CV Studies:  Echo 05/22/20: 1. Left  ventricular ejection fraction, by estimation, is 60 to 65%. The  left ventricle has normal function. The left ventricle has no regional  wall motion abnormalities. Left ventricular diastolic parameters are  indeterminate.  2. Right ventricular systolic function is normal. The right ventricular  size is normal. There is normal pulmonary artery systolic pressure.  3. Left atrial size was mildly dilated.  4. The mitral valve is normal in structure. Trivial mitral valve  regurgitation.  5. The aortic valve is tricuspid. Aortic valve regurgitation is trivial.  6. The inferior vena cava is normal in size with greater than 50%  respiratory variability, suggesting right atrial pressure of 3 mmHg.    Carotid US 05/22/20: Summary:  Right Carotid: Velocities in the right ICA are consistent with a 1-39%  stenosis.   Left Carotid: Velocities in the left ICA are consistent with a 1-39%  stenosis.   Vertebrals: Bilateral vertebral arteries demonstrate antegrade flow.  Subclavians: Normal flow hemodynamics were seen in bilateral subclavian arteries.   Laboratory Data:  High Sensitivity Troponin:   Recent Labs  Lab 05/21/20 1711 05/21/20 1729  TROPONINIHS 4 4     Chemistry Recent Labs  Lab 05/21/20 1117 05/21/20 1957 05/22/20 0645  NA 141  --  137  K 3.8  --  4.7  CL 105  --  106  CO2 24  --  20*  GLUCOSE 93  --  68*  BUN 27*  --  23  CREATININE 1.23* 1.06* 1.05*  CALCIUM 8.6*  --  8.4*  GFRNONAA 47* 56* 56*  ANIONGAP 12  --  11    No results for input(s): PROT, ALBUMIN, AST, ALT, ALKPHOS, BILITOT in the last 168 hours. Hematology Recent Labs  Lab 05/21/20 1117 05/21/20 1957  WBC 9.5 10.2  RBC 4.18 4.36  HGB 12.4 13.0  HCT 40.8 42.1  MCV 97.6 96.6  MCH 29.7 29.8  MCHC 30.4 30.9  RDW 13.3 13.1  PLT 248 294   BNPNo results for input(s): BNP, PROBNP in the last 168 hours.  DDimer No results for input(s):  DDIMER in the last 168 hours.   Radiology/Studies:  CT  HEAD WO CONTRAST  Result Date: 05/21/2020 CLINICAL DATA:  Mental status change. EXAM: CT HEAD WITHOUT CONTRAST TECHNIQUE: Contiguous axial images were obtained from the base of the skull through the vertex without intravenous contrast. COMPARISON:  None. FINDINGS: Brain: Similar position of a right frontal ventriculostomy catheter with the tip at the right frontal horn. Similar hypoattenuation surrounding the shunt catheter. Visualized portions of the shunt catheter appear intact. The ventricular system is slightly more decompressed than on the prior without evidence of hydrocephalus. Similar encephalomalacia subjacent to a right parietal burr hole. No evidence of acute large vascular territory infarct. No acute hemorrhage. No mass lesion or abnormal mass effect. Basal cisterns are patent. Similar additional patchy white matter hypoattenuation, most likely related to chronic microvascular ischemia. Vascular: No hyperdense vessel identified. Calcific atherosclerosis. Skull: Right frontal and right parietal burr holes. No acute fracture. Sinuses/Orbits: Complete opacification of the visualized left maxillary sinus with internal hyperdensity. Unremarkable orbits. Other: No mastoid effusions. IMPRESSION: 1. Similar position of the right frontal ventriculostomy catheter with slightly more decompressed ventricular system and no evidence of hydrocephalus. Correlate with evidence of over shunting. 2. Otherwise, similar chronic findings without evidence of acute intracranial abnormality. 3. Chronic left maxillary sinus disease with internal hyperdensity that may represent fungal colonization and/or inspissated secretions. Electronically Signed   By: Margaretha Sheffield MD   On: 05/21/2020 12:29   DG Chest Port 1 View  Result Date: 05/21/2020 CLINICAL DATA:  Syncopal episode EXAM: PORTABLE CHEST 1 VIEW COMPARISON:  04/20/2020 FINDINGS: The patient has taken a poor inspiration. Allowing for that, the heart and mediastinum  are normal. The lungs are clear. The vascularity is normal. No effusions. Distant shoulder replacement on the left. Chronic degenerative changes of the right shoulder. IMPRESSION: Poor inspiration. No active disease. Electronically Signed   By: Nelson Chimes M.D.   On: 05/21/2020 16:10   EEG adult  Result Date: 05/23/2020 Lora Havens, MD     05/23/2020 10:29 AM Patient Name: Renee Raymond MRN: [id number] Epilepsy Attending: Lora Havens Referring Physician/Provider: Dr Hosie Poisson Date: 05/22/2020 Duration: 24.28 mins Patient history: 75yo F with syncope. EEG to evaluate for seizure Level of alertness: Awake AEDs during EEG study: None Technical aspects: This EEG study was done with scalp electrodes positioned according to the 10-20 International system of electrode placement. Electrical activity was acquired at a sampling rate of 500Hz  and reviewed with a high frequency filter of 70Hz  and a low frequency filter of 1Hz . EEG data were recorded continuously and digitally stored. Description: No clear posterior dominant rhythm was seen.  EEG showed continuous generalized polymorphic mixed frequencies with predominantly 5 to 9 Hz theta-alpha activity as well as 2 to 3 Hz delta slowing.  Single sharp transient was seen in left> right frontal region.  Hyperventilation and photic stimulation were not performed.   ABNORMALITY -Continuous slow, generalized IMPRESSION: This study is suggestive of moderate diffuse encephalopathy, nonspecific etiology. No seizures or definite epileptiform discharges were seen throughout the recording. Lora Havens   ECHOCARDIOGRAM COMPLETE  Result Date: 05/22/2020    ECHOCARDIOGRAM REPORT   Patient Name:   Renee Raymond Date of Exam: 05/22/2020 Medical Rec #:  [id number]            Height:       57.0 in Accession #:    [id number]           Weight:  139.1 lb Date of Birth:  01/13/1946            BSA:          1.541 m Patient Age:    74 years             BP:            98/78 mmHg Patient Gender: F                    HR:           73 bpm. Exam Location:  Inpatient Procedure: 2D Echo, Color Doppler and Cardiac Doppler Indications:    Syncope 780.2 / R55  History:        Patient has no prior history of Echocardiogram examinations.                 Signs/Symptoms:Syncope.  Sonographer:    Bernadene Person RDCS Referring Phys: [id number] [place]  1. Left ventricular ejection fraction, by estimation, is 60 to 65%. The left ventricle has normal function. The left ventricle has no regional wall motion abnormalities. Left ventricular diastolic parameters are indeterminate.  2. Right ventricular systolic function is normal. The right ventricular size is normal. There is normal pulmonary artery systolic pressure.  3. Left atrial size was mildly dilated.  4. The mitral valve is normal in structure. Trivial mitral valve regurgitation.  5. The aortic valve is tricuspid. Aortic valve regurgitation is trivial.  6. The inferior vena cava is normal in size with greater than 50% respiratory variability, suggesting right atrial pressure of 3 mmHg. Comparison(s): No prior Echocardiogram. Conclusion(s)/Recommendation(s): Otherwise normal echocardiogram, with minor abnormalities described in the report. FINDINGS  Left Ventricle: Left ventricular ejection fraction, by estimation, is 60 to 65%. The left ventricle has normal function. The left ventricle has no regional wall motion abnormalities. The left ventricular internal cavity size was normal in size. There is  no left ventricular hypertrophy. Left ventricular diastolic parameters are indeterminate. Right Ventricle: The right ventricular size is normal. Right vetricular wall thickness was not well visualized. Right ventricular systolic function is normal. There is normal pulmonary artery systolic pressure. The tricuspid regurgitant velocity is 2.20 m/s, and with an assumed right atrial pressure of 3 mmHg, the estimated right  ventricular systolic pressure is 45.4 mmHg. Left Atrium: Left atrial size was mildly dilated. Right Atrium: Right atrial size was normal in size. Pericardium: There is no evidence of pericardial effusion. Presence of pericardial fat pad. Mitral Valve: The mitral valve is normal in structure. Trivial mitral valve regurgitation. Tricuspid Valve: The tricuspid valve is normal in structure. Tricuspid valve regurgitation is trivial. No evidence of tricuspid stenosis. Aortic Valve: The aortic valve is tricuspid. Aortic valve regurgitation is trivial. Pulmonic Valve: The pulmonic valve was grossly normal. Pulmonic valve regurgitation is not visualized. No evidence of pulmonic stenosis. Aorta: The aortic root, ascending aorta and aortic arch are all structurally normal, with no evidence of dilitation or obstruction. Venous: The inferior vena cava is normal in size with greater than 50% respiratory variability, suggesting right atrial pressure of 3 mmHg. IAS/Shunts: The atrial septum is grossly normal.  LEFT VENTRICLE PLAX 2D LVIDd:         3.60 cm  Diastology LVIDs:         2.10 cm  LV e' medial:    6.31 cm/s LV PW:         0.80 cm  LV E/e' medial:  9.6 LV IVS:  0.60 cm  LV e' lateral:   8.81 cm/s LVOT diam:     1.70 cm  LV E/e' lateral: 6.8 LV SV:         57 LV SV Index:   37 LVOT Area:     2.27 cm  RIGHT VENTRICLE RV S prime:     12.50 cm/s TAPSE (M-mode): 1.0 cm LEFT ATRIUM           Index       RIGHT ATRIUM           Index LA diam:      2.50 cm 1.62 cm/m  RA Area:     11.20 cm LA Vol (A2C): 14.6 ml 9.47 ml/m  RA Volume:   24.50 ml  15.89 ml/m LA Vol (A4C): 45.5 ml 29.52 ml/m  AORTIC VALVE LVOT Vmax:   118.00 cm/s LVOT Vmean:  84.000 cm/s LVOT VTI:    0.253 m  AORTA Ao Root diam: 2.80 cm Ao Asc diam:  3.00 cm MITRAL VALVE               TRICUSPID VALVE MV Area (PHT): 2.80 cm    TR Peak grad:   19.4 mmHg MV Decel Time: 271 msec    TR Vmax:        220.00 cm/s MV E velocity: 60.30 cm/s MV A velocity: 64.40 cm/s   SHUNTS MV E/A ratio:  0.94        Systemic VTI:  0.25 m                            Systemic Diam: 1.70 cm Buford Dresser MD Electronically signed by Buford Dresser MD Signature Date/Time: 05/22/2020/1:33:40 PM    Final    VAS US CAROTID  Result Date: 05/22/2020 Carotid Arterial Duplex Study Indications:       Syncope. Risk Factors:      No history of smoking. Other Factors:     Dementia. Comparison Study:  No previous Performing Technologist: Vonzell Schlatter RVT  Examination Guidelines: A complete evaluation includes B-mode imaging, spectral Doppler, color Doppler, and power Doppler as needed of all accessible portions of each vessel. Bilateral testing is considered an integral part of a complete examination. Limited examinations for reoccurring indications may be performed as noted.  Right Carotid Findings: +----------+--------+--------+--------+------------------+--------+           PSV cm/sEDV cm/sStenosisPlaque DescriptionComments +----------+--------+--------+--------+------------------+--------+ CCA Prox  76      23                                         +----------+--------+--------+--------+------------------+--------+ CCA Distal80      23                                         +----------+--------+--------+--------+------------------+--------+ ICA Prox  38      11      1-39%   heterogenous               +----------+--------+--------+--------+------------------+--------+ ICA Distal55      22                                         +----------+--------+--------+--------+------------------+--------+  ECA       76      15                                         +----------+--------+--------+--------+------------------+--------+ +----------+--------+-------+--------+-------------------+           PSV cm/sEDV cmsDescribeArm Pressure (mmHG) +----------+--------+-------+--------+-------------------+ [id number]                                          +----------+--------+-------+--------+-------------------+ +---------+--------+--+--------+--+ VertebralPSV cm/s33EDV cm/s10 +---------+--------+--+--------+--+  Left Carotid Findings: +----------+--------+--------+--------+------------------+--------+           PSV cm/sEDV cm/sStenosisPlaque DescriptionComments +----------+--------+--------+--------+------------------+--------+ CCA Prox  71      14                                         +----------+--------+--------+--------+------------------+--------+ CCA Distal53      15                                         +----------+--------+--------+--------+------------------+--------+ ICA Prox  45      14      1-39%   heterogenous               +----------+--------+--------+--------+------------------+--------+ ICA Distal54      17                                         +----------+--------+--------+--------+------------------+--------+ ECA       77      13                                         +----------+--------+--------+--------+------------------+--------+ +----------+--------+--------+--------+-------------------+           PSV cm/sEDV cm/sDescribeArm Pressure (mmHG) +----------+--------+--------+--------+-------------------+ [id number]                                          +----------+--------+--------+--------+-------------------+ +---------+--------+--+--------+--+ VertebralPSV cm/s38EDV cm/s11 +---------+--------+--+--------+--+   Summary: Right Carotid: Velocities in the right ICA are consistent with a 1-39% stenosis. Left Carotid: Velocities in the left ICA are consistent with a 1-39% stenosis. Vertebrals:  Bilateral vertebral arteries demonstrate antegrade flow. Subclavians: Normal flow hemodynamics were seen in bilateral subclavian              arteries. *See table(s) above for measurements and observations.  Electronically signed by Servando Snare MD on 05/22/2020 at 1:00:16 PM.     Final      Assessment and Plan:   Syncope - two witnessed falls with LOC, unfortunately, patient is unable to relay any information regarding symptoms just prior to events - carotid ultrasound with mild nonobstructive disease - EKG stable from prior  - orthostatic vitals negative for hypotension - EEG negative for seizure activity, but is encephalopathic - no arrhythmia seen on telemetry - thankfully, no indication for anticoagulation at this  time   Hx of falls Alzheimer's dementia CKD stage III VP shunt - per primary - home medications include buspiron TID, decadron, depakote TID, ativan 2-3 times daily, and seroquel BID, remeron, zoloft, and melatonin --> question if some of these could be contributing to her falls and syncope - unclear if any of these could be reduced    Difficult situation. Telemetry does not reveal arrhythmia, pauses, or high grade AV block. Given patient's dementia, doubt she would be a candidate for an implantable device. I do not think she would tolerate wearing a heart monitor and this would not significantly change management. She is on no AV blocking medications. Echo with no acute findings. No significant carotid disease. Question her PO intake and possible dehydration. Appreciate neurology recommendations regarding VP shunt, encephalopathy, and dementia. Will continue to monitor on telemetry as patient cooperation permits.     Risk Assessment/Risk Scores:     For questions or updates, please contact [facility] Please consult [email or web address] for contact info under    Signed, Ledora Bottcher, PA  05/23/2020 2:09 PM   History and all data above reviewed.  Patient examined.  I agree with the findings as above.  The patient is opening her eyes only.  She is almost nonverbal saying only a couple of words like out.  Her daughter is not at the bedside.  Be history and physical are as stated above.  Unfortunately since she has been in this room she has  not had anything to eat or drink despite valiant attempts from the staff.  She is in no distress and lying comfortably with her eyes closed.  The patient exam reveals COR: Regular rate and rhythm, no murmurs,  Lungs: Clear to auscultation,  Abd: Positive bowel sounds no rebound, no guarding, Ext no edema.  All available labs, radiology testing, previous records reviewed. Agree with documented assessment and plan.  Syncope: The etiology of the syncope is not clear.  There is no obvious cardiac source.  She has no conduction abnormalities on EKG unremarkable so far.  We will be unable to check orthostatics.  Before we consider further long-term monitoring we need to understand goals of therapy.  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  We contacted her daughter further about goals of therapy.  Not suspecting that she will need further invasive or other cardiac testing while she is here. Jeneen Rinks Loriene Taunton  5:01 PM  05/23/2020

## 2020-05-23 NOTE — Progress Notes (Signed)
Physician paged, patient agitated, hitting sitter, pulled out IV.  Order for soft restraints.

## 2020-05-23 NOTE — Progress Notes (Signed)
PROGRESS NOTE    [name]  KPT:[id number] DOB: 12-02-1945 DOA: 05/21/2020 PCP: Ronita Hipps, MD   Chief Complaint  Patient presents with  . Loss of Consciousness    Brief Narrative:  75 year old lady from SNF, with prior h/o dementia, Stage 3 a CKD, hypothyroidism, presents to ED after multiple syncopal episodes.  She was admitted for further stroke work up.   Assessment & Plan:   Principal Problem:   Syncope Active Problems:   Dementia (HCC)   CKD (chronic kidney disease), stage III (HCC)   Hypothyroidism   Recurrent syncope with multiple falls probably secondary to dehydration, differential include arrhythmias vs seizures vs orthostatic hypotension Versus hypoglycemia Initial CT of the head shows similar position of the right frontal ventriculostomy catheter with slightly more decompressed ventricular system and no evidence of hydrocephalus. similar chronic findings without evidence of acute intracranial abnormality.  MRI of the brain without contrast is pending Orthostatic vital signs normal admission. EEG suggestive of moderate diffuse encephalopathy, nonspecific etiology.No seizures or definite epileptiform discharges were seen throughout the recording.  Echocardiogram showed preserved LVEF and no wall motion abn.  EKG does not show any heart blocks.  Telemetry monitoring overnight to look for arrhythmias.  Carotid duplex in the right ICA and  left ICA are consistent with a 1-39% stenosis.   Cardiology consulted for further evaluation for recurrent syncope    Mild acute on stage 3a CKD: Creatinine improved with Hydration.  Creatinine on admission at 1.2 improved to 1.1 with fluids.    Hypothyroidism:  Resume synthroid.    Dementia:  Continue with Aricept and Namenda   Multiple episodes of hypoglycemia; Last A1c is 5.4 and start the patient dextrose normal saline fluids.    DVT prophylaxis: (Lovenox) Code Status: (Full code) Family  Communication: discussed with daughter at bedside Disposition:   Status is: Inpatient  The patient will require care spanning > 2 midnights and should be moved to inpatient because: Ongoing diagnostic testing needed not appropriate for outpatient work up  Dispo: The patient is from: SNF              Anticipated d/c is to: SNF              Anticipated d/c date is: 3 days              Patient currently is not medically stable to d/c.   Difficult to place patient No       Level of care: Telemetry Medical Consultants:   Cardiology  Palliative care   Procedures:   Echocardiogram.  EEG  MRI of the brain without contrast   Antimicrobials:   None.    Subjective: Patient briefly opens eyes with verbal cues, has moderate dementia  Objective: Vitals:   05/23/20 1200 05/23/20 1300 05/23/20 1453 05/23/20 1555  BP: 127/77 118/77 119/80   Pulse: 81 89 85   Resp: (!) 22 (!) 23 20   Temp:   99 F (37.2 C)   TempSrc:      SpO2: 100% 97% 95%   Weight:    64.2 kg    Intake/Output Summary (Last 24 hours) at 05/23/2020 1638 Last data filed at 05/23/2020 1500 Gross per 24 hour  Intake 284.25 ml  Output --  Net 284.25 ml   Filed Weights   05/23/20 1555  Weight: 64.2 kg    Examination:  General exam: Alert and comfortable. Respiratory system: Clear to auscultation, no wheezing or rhonchi. Respiratory effort  normal. Cardiovascular system: S1-S2 heard, regular rate rhythm, no JVD Gastrointestinal system: Abdomen is soft, nondistended, bowel sounds normal Central nervous system: Sleepy, confused, able to move all extremities Extremities: No pedal edema Skin: No rashes seen Psychiatry: Cannot be assessed    Data Reviewed: I have personally reviewed following labs and imaging studies  CBC: Recent Labs  Lab 05/21/20 1117 05/21/20 1957  WBC 9.5 10.2  HGB 12.4 13.0  HCT 40.8 42.1  MCV 97.6 96.6  PLT 248 932    Basic Metabolic Panel: Recent Labs  Lab  05/21/20 1117 05/21/20 1957 05/22/20 0645 June 07, 2020 1312  NA 141  --  137 139  K 3.8  --  4.7 4.5  CL 105  --  106 108  CO2 24  --  20* 16*  GLUCOSE 93  --  68* 69*  BUN 27*  --  23 20  CREATININE 1.23* 1.06* 1.05* 1.11*  CALCIUM 8.6*  --  8.4* 8.6*  MG  --   --   --  2.1    GFR: Estimated Creatinine Clearance: 35.3 mL/min (A) (by C-G formula based on SCr of 1.11 mg/dL (H)).  Liver Function Tests: No results for input(s): AST, ALT, ALKPHOS, BILITOT, PROT, ALBUMIN in the last 168 hours.  CBG: Recent Labs  Lab 05/21/20 1123 05/22/20 0808 June 07, 2020 0541 June 07, 2020 1336 07-Jun-2020 1435  GLUCAP 88 74 81 46* 133*     Recent Results (from the past 240 hour(s))  SARS CORONAVIRUS 2 (TAT 6-24 HRS) Nasopharyngeal Urine, Clean Catch     Status: None   Collection Time: 05/21/20  7:57 PM   Specimen: Urine, Clean Catch; Nasopharyngeal  Result Value Ref Range Status   SARS Coronavirus 2 NEGATIVE NEGATIVE Final    Comment: (NOTE) SARS-CoV-2 target nucleic acids are NOT DETECTED.  The SARS-CoV-2 RNA is generally detectable in upper and lower respiratory specimens during the acute phase of infection. Negative results do not preclude SARS-CoV-2 infection, do not rule out co-infections with other pathogens, and should not be used as the sole basis for treatment or other patient management decisions. Negative results must be combined with clinical observations, patient history, and epidemiological information. The expected result is Negative.  Fact Sheet for Patients: SugarRoll.be  Fact Sheet for Healthcare Providers: https://www.woods-mathews.com/  This test is not yet approved or cleared by the [place] FDA and  has been authorized for detection and/or diagnosis of SARS-CoV-2 by FDA under an Emergency Use Authorization (EUA). This EUA will remain  in effect (meaning this test can be used) for the duration of the COVID-19 declaration  under Se ction 564(b)(1) of the Act, 21 U.S.C. section 360bbb-3(b)(1), unless the authorization is terminated or revoked sooner.  Performed at [facility], [place] [address], [place], [place] [address]          Radiology Studies: EEG adult  Result Date: 07-Jun-2020 Lora Havens, MD     07-Jun-2020 10:29 AM Patient Name: Renee Raymond MRN: [id number] Epilepsy Attending: Lora Havens Referring Physician/Provider: Dr Hosie Poisson Date: 05/22/2020 Duration: 24.28 mins Patient history: 75yo F with syncope. EEG to evaluate for seizure Level of alertness: Awake AEDs during EEG study: None Technical aspects: This EEG study was done with scalp electrodes positioned according to the 10-20 International system of electrode placement. Electrical activity was acquired at a sampling rate of 500Hz  and reviewed with a high frequency filter of 70Hz  and a low frequency filter of 1Hz . EEG data were recorded continuously and digitally  stored. Description: No clear posterior dominant rhythm was seen.  EEG showed continuous generalized polymorphic mixed frequencies with predominantly 5 to 9 Hz theta-alpha activity as well as 2 to 3 Hz delta slowing.  Single sharp transient was seen in left> right frontal region.  Hyperventilation and photic stimulation were not performed.   ABNORMALITY -Continuous slow, generalized IMPRESSION: This study is suggestive of moderate diffuse encephalopathy, nonspecific etiology. No seizures or definite epileptiform discharges were seen throughout the recording. Lora Havens   ECHOCARDIOGRAM COMPLETE  Result Date: 05/22/2020    ECHOCARDIOGRAM REPORT   Patient Name:   FLORESTINE CARMICAL Date of Exam: 05/22/2020 Medical Rec #:  [id number]            Height:       57.0 in Accession #:    [id number]           Weight:       139.1 lb Date of Birth:  28-Dec-1945            BSA:          1.541 m Patient Age:    64 years             BP:           98/78 mmHg Patient Gender: F                     HR:           73 bpm. Exam Location:  Inpatient Procedure: 2D Echo, Color Doppler and Cardiac Doppler Indications:    Syncope 780.2 / R55  History:        Patient has no prior history of Echocardiogram examinations.                 Signs/Symptoms:Syncope.  Sonographer:    Bernadene Person RDCS Referring Phys: [id number] [place]  1. Left ventricular ejection fraction, by estimation, is 60 to 65%. The left ventricle has normal function. The left ventricle has no regional wall motion abnormalities. Left ventricular diastolic parameters are indeterminate.  2. Right ventricular systolic function is normal. The right ventricular size is normal. There is normal pulmonary artery systolic pressure.  3. Left atrial size was mildly dilated.  4. The mitral valve is normal in structure. Trivial mitral valve regurgitation.  5. The aortic valve is tricuspid. Aortic valve regurgitation is trivial.  6. The inferior vena cava is normal in size with greater than 50% respiratory variability, suggesting right atrial pressure of 3 mmHg. Comparison(s): No prior Echocardiogram. Conclusion(s)/Recommendation(s): Otherwise normal echocardiogram, with minor abnormalities described in the report. FINDINGS  Left Ventricle: Left ventricular ejection fraction, by estimation, is 60 to 65%. The left ventricle has normal function. The left ventricle has no regional wall motion abnormalities. The left ventricular internal cavity size was normal in size. There is  no left ventricular hypertrophy. Left ventricular diastolic parameters are indeterminate. Right Ventricle: The right ventricular size is normal. Right vetricular wall thickness was not well visualized. Right ventricular systolic function is normal. There is normal pulmonary artery systolic pressure. The tricuspid regurgitant velocity is 2.20 m/s, and with an assumed right atrial pressure of 3 mmHg, the estimated right ventricular systolic pressure is 17.0 mmHg.  Left Atrium: Left atrial size was mildly dilated. Right Atrium: Right atrial size was normal in size. Pericardium: There is no evidence of pericardial effusion. Presence of pericardial fat pad. Mitral Valve: The mitral valve is normal in structure. Trivial mitral valve  regurgitation. Tricuspid Valve: The tricuspid valve is normal in structure. Tricuspid valve regurgitation is trivial. No evidence of tricuspid stenosis. Aortic Valve: The aortic valve is tricuspid. Aortic valve regurgitation is trivial. Pulmonic Valve: The pulmonic valve was grossly normal. Pulmonic valve regurgitation is not visualized. No evidence of pulmonic stenosis. Aorta: The aortic root, ascending aorta and aortic arch are all structurally normal, with no evidence of dilitation or obstruction. Venous: The inferior vena cava is normal in size with greater than 50% respiratory variability, suggesting right atrial pressure of 3 mmHg. IAS/Shunts: The atrial septum is grossly normal.  LEFT VENTRICLE PLAX 2D LVIDd:         3.60 cm  Diastology LVIDs:         2.10 cm  LV e' medial:    6.31 cm/s LV PW:         0.80 cm  LV E/e' medial:  9.6 LV IVS:        0.60 cm  LV e' lateral:   8.81 cm/s LVOT diam:     1.70 cm  LV E/e' lateral: 6.8 LV SV:         57 LV SV Index:   37 LVOT Area:     2.27 cm  RIGHT VENTRICLE RV S prime:     12.50 cm/s TAPSE (M-mode): 1.0 cm LEFT ATRIUM           Index       RIGHT ATRIUM           Index LA diam:      2.50 cm 1.62 cm/m  RA Area:     11.20 cm LA Vol (A2C): 14.6 ml 9.47 ml/m  RA Volume:   24.50 ml  15.89 ml/m LA Vol (A4C): 45.5 ml 29.52 ml/m  AORTIC VALVE LVOT Vmax:   118.00 cm/s LVOT Vmean:  84.000 cm/s LVOT VTI:    0.253 m  AORTA Ao Root diam: 2.80 cm Ao Asc diam:  3.00 cm MITRAL VALVE               TRICUSPID VALVE MV Area (PHT): 2.80 cm    TR Peak grad:   19.4 mmHg MV Decel Time: 271 msec    TR Vmax:        220.00 cm/s MV E velocity: 60.30 cm/s MV A velocity: 64.40 cm/s  SHUNTS MV E/A ratio:  0.94        Systemic  VTI:  0.25 m                            Systemic Diam: 1.70 cm Buford Dresser MD Electronically signed by Buford Dresser MD Signature Date/Time: 05/22/2020/1:33:40 PM    Final    VAS US CAROTID  Result Date: 05/22/2020 Carotid Arterial Duplex Study Indications:       Syncope. Risk Factors:      No history of smoking. Other Factors:     Dementia. Comparison Study:  No previous Performing Technologist: Vonzell Schlatter RVT  Examination Guidelines: A complete evaluation includes B-mode imaging, spectral Doppler, color Doppler, and power Doppler as needed of all accessible portions of each vessel. Bilateral testing is considered an integral part of a complete examination. Limited examinations for reoccurring indications may be performed as noted.  Right Carotid Findings: +----------+--------+--------+--------+------------------+--------+           PSV cm/sEDV cm/sStenosisPlaque DescriptionComments +----------+--------+--------+--------+------------------+--------+ CCA Prox  76      23                                         +----------+--------+--------+--------+------------------+--------+  CCA Distal80      23                                         +----------+--------+--------+--------+------------------+--------+ ICA Prox  38      11      1-39%   heterogenous               +----------+--------+--------+--------+------------------+--------+ ICA Distal55      22                                         +----------+--------+--------+--------+------------------+--------+ ECA       76      15                                         +----------+--------+--------+--------+------------------+--------+ +----------+--------+-------+--------+-------------------+           PSV cm/sEDV cmsDescribeArm Pressure (mmHG) +----------+--------+-------+--------+-------------------+ [id number]                                          +----------+--------+-------+--------+-------------------+ +---------+--------+--+--------+--+ VertebralPSV cm/s33EDV cm/s10 +---------+--------+--+--------+--+  Left Carotid Findings: +----------+--------+--------+--------+------------------+--------+           PSV cm/sEDV cm/sStenosisPlaque DescriptionComments +----------+--------+--------+--------+------------------+--------+ CCA Prox  71      14                                         +----------+--------+--------+--------+------------------+--------+ CCA Distal53      15                                         +----------+--------+--------+--------+------------------+--------+ ICA Prox  45      14      1-39%   heterogenous               +----------+--------+--------+--------+------------------+--------+ ICA Distal54      17                                         +----------+--------+--------+--------+------------------+--------+ ECA       77      13                                         +----------+--------+--------+--------+------------------+--------+ +----------+--------+--------+--------+-------------------+           PSV cm/sEDV cm/sDescribeArm Pressure (mmHG) +----------+--------+--------+--------+-------------------+ [id number]                                          +----------+--------+--------+--------+-------------------+ +---------+--------+--+--------+--+ VertebralPSV cm/s38EDV cm/s11 +---------+--------+--+--------+--+   Summary: Right Carotid: Velocities in the right ICA are consistent with a 1-39% stenosis. Left Carotid: Velocities in the left ICA are consistent  with a 1-39% stenosis. Vertebrals:  Bilateral vertebral arteries demonstrate antegrade flow. Subclavians: Normal flow hemodynamics were seen in bilateral subclavian              arteries. *See table(s) above for measurements and observations.  Electronically signed by Servando Snare MD on 05/22/2020 at 1:00:16 PM.     Final         Scheduled Meds: . donepezil  10 mg Oral QHS  . enoxaparin (LOVENOX) injection  40 mg Subcutaneous Q24H  . levothyroxine  25 mcg Oral Q0600  . memantine  10 mg Oral Daily  . sertraline  25 mg Oral Daily  . sodium chloride flush  3 mL Intravenous Q12H  . traZODone  50 mg Oral QHS   Continuous Infusions: . dextrose 5 % and 0.9% NaCl 75 mL/hr at 05/23/20 1355     LOS: 1 day       Hosie Poisson, MD [facility]   To contact the attending provider between 7A-7P or the covering provider during after hours 7P-7A, please log into the web site [email or web address] and access using universal [place] password for that web site. If you do not have the password, please call the hospital operator.  05/23/2020, 4:38 PM

## 2020-05-23 NOTE — ED Notes (Signed)
Lunch Tray Ordered @ 1030. 

## 2020-05-23 NOTE — ED Notes (Signed)
Breakfast ordered 

## 2020-05-23 NOTE — Progress Notes (Signed)
Attempted to contact family to notify of patient being placed in restraints.  No answer x2.  Will pass along to day shift to attempt to notify.

## 2020-05-23 NOTE — Progress Notes (Signed)
Per Rad, Dr. Ronnald Ramp, would like to have shunt information in order to proceed with MRI. Unable to proceed with scan without information. Daughter does not have any information on shunt

## 2020-05-23 NOTE — Progress Notes (Signed)
NEW ADMISSION NOTE  Arrival Method: bed Mental Orientation: Alert and oriented to self Telemetry: yes Assessment: Completed Skin: see notes Iv: left upper arm Pain: 0 Tubes: 1 Safety Measures: Safety Fall Prevention Plan has been given, discussed and signed Admission: Completed [facility] Orientation: Patient has been orientated to the room, unit and staff.  Family: Daughter Piedad Climes  Orders have been reviewed and implemented. Will continue to monitor the patient. Call light has been placed within reach and bed alarm has been activated.  Patient is a poor historian and unable to answer admitting questions  Beatris Ship, RN

## 2020-05-23 NOTE — Procedures (Addendum)
Patient Name: Karlea Mckibbin  MRN: [id number]  Epilepsy Attending: Lora Havens  Referring Physician/Provider: Dr Hosie Poisson Date: 05/22/2020 Duration: 24.28 mins  Patient history: 75yo F with syncope. EEG to evaluate for seizure  Level of alertness: Awake  AEDs during EEG study: None  Technical aspects: This EEG study was done with scalp electrodes positioned according to the 10-20 International system of electrode placement. Electrical activity was acquired at a sampling rate of 500Hz  and reviewed with a high frequency filter of 70Hz  and a low frequency filter of 1Hz . EEG data were recorded continuously and digitally stored.   Description: No clear posterior dominant rhythm was seen.  EEG showed continuous generalized polymorphic mixed frequencies with predominantly 5 to 9 Hz theta-alpha activity as well as 2 to 3 Hz delta slowing.  Single sharp transient was seen in left> right frontal region.  Hyperventilation and photic stimulation were not performed.     ABNORMALITY -Continuous slow, generalized  IMPRESSION: This study is suggestive of moderate diffuse encephalopathy, nonspecific etiology. No seizures or definite epileptiform discharges were seen throughout the recording.  Sheilah Rayos Barbra Sarks

## 2020-05-23 NOTE — Progress Notes (Signed)
   05/23/20 1453  Assess: MEWS Score  Temp 99 F (37.2 C)  BP 119/80  Pulse Rate 85  Resp 20  SpO2 95 %  O2 Device Room Air  Assess: MEWS Score  MEWS Temp 0  MEWS Systolic 0  MEWS Pulse 0  MEWS RR 0  MEWS LOC 2  MEWS Score 2  MEWS Score Color Yellow  Assess: if the MEWS score is Yellow or Red  Were vital signs taken at a resting state? Yes  Focused Assessment No change from prior assessment  Early Detection of Sepsis Score *See Row Information* Low  MEWS guidelines implemented *See Row Information* Yes  Treat  Pain Scale Faces  Faces Pain Scale 0  Take Vital Signs  Increase Vital Sign Frequency  Yellow: Q 2hr X 2 then Q 4hr X 2, if remains yellow, continue Q 4hrs  Notify: Charge Nurse/RN  Name of Charge Nurse/RN Notified Nichola Gauntlett  Date Charge Nurse/RN Notified 05/23/20  Time Charge Nurse/RN Notified [id number]  Notify: Provider  Provider Name/Title Hosie Poisson  Date Provider Notified 05/23/20  Time Provider Notified 1454  Notification Type Page  Notification Reason Change in status  Response No new orders  Date of Provider Response 05/23/20  Time of Provider Response 1455  Document  Patient Outcome Other (Comment) (RN continuing to monitor patient)  Progress note created (see row info) Yes

## 2020-05-24 DIAGNOSIS — Z515 Encounter for palliative care: Secondary | ICD-10-CM

## 2020-05-24 DIAGNOSIS — G3183 Dementia with Lewy bodies: Secondary | ICD-10-CM | POA: Diagnosis not present

## 2020-05-24 DIAGNOSIS — R55 Syncope and collapse: Secondary | ICD-10-CM | POA: Diagnosis not present

## 2020-05-24 DIAGNOSIS — Z7189 Other specified counseling: Secondary | ICD-10-CM | POA: Diagnosis not present

## 2020-05-24 DIAGNOSIS — F0281 Dementia in other diseases classified elsewhere with behavioral disturbance: Secondary | ICD-10-CM

## 2020-05-24 DIAGNOSIS — N1831 Chronic kidney disease, stage 3a: Secondary | ICD-10-CM | POA: Diagnosis not present

## 2020-05-24 DIAGNOSIS — Z66 Do not resuscitate: Secondary | ICD-10-CM | POA: Diagnosis not present

## 2020-05-24 LAB — GLUCOSE, CAPILLARY
Glucose-Capillary: 92 mg/dL (ref 70–99)
Glucose-Capillary: 75 mg/dL (ref 70–99)
Glucose-Capillary: 72 mg/dL (ref 70–99)

## 2020-05-24 MED ORDER — MORPHINE SULFATE (PF) 2 MG/ML IV SOLN: 1.0000 mg | INTRAVENOUS | Status: DC | PRN

## 2020-05-24 MED ORDER — GLYCOPYRROLATE 0.2 MG/ML IJ SOLN: 0.4000 mg | INTRAMUSCULAR | Status: DC | PRN

## 2020-05-24 MED ORDER — BISACODYL 10 MG RE SUPP: 10.0000 mg | Freq: Every day | RECTAL | Status: DC | PRN

## 2020-05-24 MED ORDER — ONDANSETRON HCL 4 MG/2ML IJ SOLN: 4.0000 mg | Freq: Four times a day (QID) | INTRAMUSCULAR | Status: DC | PRN

## 2020-05-24 MED ORDER — LORAZEPAM 2 MG/ML IJ SOLN
0.5000 mg | INTRAMUSCULAR | Status: DC | PRN
Start: 2020-05-24 — End: 2020-05-25
  Administered 2020-05-24 – 2020-05-25 (×3): 1 mg via INTRAVENOUS
  Filled 2020-05-24 (×3): qty 1
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
## 2020-05-24 NOTE — Progress Notes (Signed)
Progress Note  Patient Name: Renee Raymond Date of Encounter: 05/24/2020  Primary Cardiologist:   Minus Breeding, MD   Subjective   No distress.  She will not open her eyes or answer questions.    Inpatient Medications    Scheduled Meds: . donepezil  10 mg Oral QHS  . enoxaparin (LOVENOX) injection  40 mg Subcutaneous Q24H  . levothyroxine  25 mcg Oral Q0600  . memantine  10 mg Oral Daily  . sertraline  25 mg Oral Daily  . sodium chloride flush  3 mL Intravenous Q12H  . traZODone  50 mg Oral QHS   Continuous Infusions: . dextrose 5 % and 0.9% NaCl 75 mL/hr at 05/24/20 0121   PRN Meds: acetaminophen **OR** acetaminophen, haloperidol lactate, senna-docusate   Vital Signs    Vitals:   05/23/20 1848 05/23/20 2034 05/24/20 0600 05/24/20 1110  BP: 117/69 124/70 (!) 99/56 (!) (P) 121/94  Pulse: 91 (!) 107 81 (P) 61  Resp: 19 18 20  (P) 18  Temp: 98.5 F (36.9 C) 97.8 F (36.6 C) 97.6 F (36.4 C) (P) 99.3 F (37.4 C)  TempSrc:  Axillary Oral (P) Axillary  SpO2: 97% 97% 100% (P) 98%  Weight:        Intake/Output Summary (Last 24 hours) at 05/24/2020 1200 Last data filed at 05/24/2020 0600 Gross per 24 hour  Intake 1064.17 ml  Output -  Net 1064.17 ml   Filed Weights   05/23/20 1555  Weight: 64.2 kg    Telemetry    NSR - Personally Reviewed  ECG    NA - Personally Reviewed  Physical Exam   GEN: No acute distress.   Neck: No  JVD Cardiac: RRR, no murmurs, rubs, or gallops.  Respiratory: Clear  to auscultation bilaterally. GI: Soft, nontender, non-distended  MS: No  edema; No deformity. Neuro:  Nonfocal  Psych:  Cannot assess.   Labs    Chemistry Recent Labs  Lab 05/21/20 1117 05/21/20 1957 05/22/20 0645 05/23/20 1312  NA 141  --  137 139  K 3.8  --  4.7 4.5  CL 105  --  106 108  CO2 24  --  20* 16*  GLUCOSE 93  --  68* 69*  BUN 27*  --  23 20  CREATININE 1.23* 1.06* 1.05* 1.11*  CALCIUM 8.6*  --  8.4* 8.6*  GFRNONAA 47* 56* 56*  53*  ANIONGAP 12  --  11 15     Hematology Recent Labs  Lab 05/21/20 1117 05/21/20 1957  WBC 9.5 10.2  RBC 4.18 4.36  HGB 12.4 13.0  HCT 40.8 42.1  MCV 97.6 96.6  MCH 29.7 29.8  MCHC 30.4 30.9  RDW 13.3 13.1  PLT 248 294    Cardiac EnzymesNo results for input(s): TROPONINI in the last 168 hours. No results for input(s): TROPIPOC in the last 168 hours.   BNPNo results for input(s): BNP, PROBNP in the last 168 hours.   DDimer No results for input(s): DDIMER in the last 168 hours.   Radiology    EEG adult  Result Date: 05/23/2020 Lora Havens, MD     05/23/2020 10:29 AM Patient Name: Renee Raymond MRN: [id number] Epilepsy Attending: Lora Havens Referring Physician/Provider: Dr Hosie Poisson Date: 05/22/2020 Duration: 24.28 mins Patient history: 75yo F with syncope. EEG to evaluate for seizure Level of alertness: Awake AEDs during EEG study: None Technical aspects: This EEG study was done with scalp electrodes positioned according to the  10-20 International system of electrode placement. Electrical activity was acquired at a sampling rate of 500Hz  and reviewed with a high frequency filter of 70Hz  and a low frequency filter of 1Hz . EEG data were recorded continuously and digitally stored. Description: No clear posterior dominant rhythm was seen.  EEG showed continuous generalized polymorphic mixed frequencies with predominantly 5 to 9 Hz theta-alpha activity as well as 2 to 3 Hz delta slowing.  Single sharp transient was seen in left> right frontal region.  Hyperventilation and photic stimulation were not performed.   ABNORMALITY -Continuous slow, generalized IMPRESSION: This study is suggestive of moderate diffuse encephalopathy, nonspecific etiology. No seizures or definite epileptiform discharges were seen throughout the recording. [name]    Cardiac Studies   ECHO:  IMPRESSIONS   1. Left ventricular ejection fraction, by estimation, is 60 to 65%. The   left ventricle has normal function. The left ventricle has no regional  wall motion abnormalities. Left ventricular diastolic parameters are  indeterminate.  2. Right ventricular systolic function is normal. The right ventricular  size is normal. There is normal pulmonary artery systolic pressure.  3. Left atrial size was mildly dilated.  4. The mitral valve is normal in structure. Trivial mitral valve  regurgitation.  5. The aortic valve is tricuspid. Aortic valve regurgitation is trivial.  6. The inferior vena cava is normal in size with greater than 50%  respiratory variability, suggesting right atrial pressure of 3 mmHg.   Patient Profile     75 y.o. female with a hx of Alzheimer's dementia, VP shunt, GERD, and CKD stage III who is being seen today for the evaluation of recurrent syncope at the request of Dr. Karleen Hampshire  Assessment & Plan    SYNCOPE:  Echo unremarkable.  No carotid stenosis.  Still not answering questions or opening her eyes.   Telemetry unremarkable.  No further cardiac work up.  No indication for long term monitoring as it is unlikely that arrhythmia is the etiology.  Likely orthostatic, weakness, and general failure to thrive is the primary issue.  Please call with further questions.   For questions or updates, please contact [place] Please consult [email or web address] for contact info under Cardiology/STEMI.   Signed, Minus Breeding, MD  05/24/2020, 12:00 PM

## 2020-05-24 NOTE — Consult Note (Addendum)
Neurology Consultation  Reason for Consult: Syncope with VP shunt  Referring Physician: Dr. Karleen Hampshire  CC: Syncopal episodes with VP shunt  History is obtained from: Daughter and Husband at bedside, Chart reveiw  HPI: Aynsley Fleet is a 75 y.o. 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 Ms. Marxen resides in a memory care unit where she is usually ambulatory without assistance and paces around the hallway. Recently, she has fallen multiple times and her daughter notes that is abnormal for her. Her facility contacted EMS for evaluation 2/9 after witnessing Ms. Huxford start to fall, assisting her to the ground, and noticing her lose consciousness for approximately one minute. She had a second syncopal episode for another minute at her facility before EMS transport to the ED.   Of note, Ms. Julius has had syncopal episodes before in 2001 when it was identified that her shunt had malfunctioned and needed to be replaced. She has not had the same complications since the replacement in 2001. At this time, daughter believes that the shunt was clotted off and Ms. Buehrle had hydrocephalus.  Ms. [facility] hospital stay has been complicated by a further decline in her mental status, aggressive and agitated behavior requiring medication administration, decreased PO intake, and multiple episodes of hypoglycemia that may all contribute to her further decline. She is currently non-verbal and does not respond to verbal stimuli, this is an acute change per family from two days ago. CT head imaging showed the shunt position is similar but with a slightly more decompressed ventricular system from previous imaging in May 2021. On further chart review, CT head from November 2020 with impression including: "Slitlike ventricles".  ROS: Unable to obtain due to altered mental  status.   Past Medical History:  Diagnosis Date  . CKD (chronic kidney disease) stage 3, GFR 30-59 ml/min (HCC)   . Dementia ([place])   . GERD (gastroesophageal reflux disease)   . S/P VP shunt    hx of obstruction in 2001 associated with syncope  . Syncope 05/23/2020   History reviewed. No pertinent family history.  Social History:   reports that she has never smoked. She has never used smokeless tobacco. She reports previous alcohol use. She reports previous drug use.  Home Medications:   Inpatient Medications  Current Facility-Administered Medications:  .  acetaminophen (TYLENOL) tablet 650 mg, 650 mg, Oral, Q6H PRN **OR** acetaminophen (TYLENOL) suppository 650 mg, 650 mg, Rectal, Q6H PRN, Chotiner, Yevonne Aline, MD .  dextrose 5 %-0.9 % sodium chloride infusion, , Intravenous, Continuous, Hosie Poisson, MD, Last Rate: 75 mL/hr at 05/24/20 0121, New Bag at 05/24/20 0121 .  donepezil (ARICEPT) tablet 10 mg, 10 mg, Oral, QHS, Chotiner, Yevonne Aline, MD, 10 mg at 05/22/20 2303 .  enoxaparin (LOVENOX) injection 40 mg, 40 mg, Subcutaneous, Q24H, Chotiner, Yevonne Aline, MD, 40 mg at 05/23/20 2152 .  haloperidol lactate (HALDOL) injection 2 mg, 2 mg, Intravenous, Q6H PRN, Opyd, Ilene Qua, MD .  levothyroxine (SYNTHROID) tablet 25 mcg, 25 mcg, Oral, Q0600, Chotiner, Yevonne Aline, MD, 25 mcg at 05/23/20 0540 .  memantine [facility]) tablet 10 mg, 10 mg, Oral, Daily, Chotiner, Yevonne Aline, MD, 10 mg at 05/21/20 2159 .  senna-docusate (Senokot-S) tablet 1 tablet, 1 tablet, Oral, QHS PRN, Chotiner, Yevonne Aline, MD .  sertraline (ZOLOFT) tablet 25 mg, 25 mg, Oral, Daily, Chotiner, Yevonne Aline, MD, 25  mg at 05/21/20 2159 .  sodium chloride flush (NS) 0.9 % injection 3 mL, 3 mL, Intravenous, Q12H, Chotiner, Yevonne Aline, MD, 3 mL at 05/21/20 2132 .  traZODone (DESYREL) tablet 50 mg, 50 mg, Oral, QHS, Hosie Poisson, MD, 50 mg at 05/22/20 2303   Current Outpatient Medications  Medication Instructions  . acetaminophen  (TYLENOL) 650 mg, Oral, Every 6 hours PRN  . busPIRone (BUSPAR) 10 mg, Oral, 3 times daily  . dexamethasone (DECADRON) 0.75 mg, Oral, 2 times daily  . divalproex (DEPAKOTE) 125 mg, Oral, 3 times daily  . docusate sodium (COLACE) 100 mg, Oral, 2 times daily  . Ensure Plus (ENSURE PLUS) LIQD 237 mLs, Oral, 3 times daily between meals  . Lactobacillus TABS 1 tablet, Oral, 3 times daily  . LORazepam (ATIVAN) 0.5 mg, Oral, See admin instructions, Takes 1 tablet (0.5 mg totally) by mouth 2 times daily; takes extra 1 tablet (0.5 mg totally) by mouth as needed for anxiety/aggression  . melatonin 1 mg, Oral, Daily at bedtime  . mirtazapine (REMERON) 7.5 mg, Oral, Daily at bedtime  . pantoprazole (PROTONIX) 40 mg, Oral, Daily  . QUEtiapine (SEROQUEL) 200 mg, Oral, Daily at bedtime  . QUEtiapine (SEROQUEL) 50 mg, Oral, Every morning  . sertraline (ZOLOFT) 25 mg, Oral, Daily  . traZODone (DESYREL) 100 mg, Oral, Daily at bedtime   Exam: Current vital signs: BP (!) (P) 121/94 (BP Location: Right Arm)   Pulse (P) 61   Temp (P) 99.3 F (37.4 C) (Axillary)   Resp (P) 18   Wt 64.2 kg   SpO2 (P) 98%   BMI 30.63 kg/m  Vital signs in last 24 hours: Temp:  [97.6 F (36.4 C)-99.6 F (37.6 C)] (P) 99.3 F (37.4 C) (02/12 1110) Pulse Rate:  [61-107] (P) 61 (02/12 1110) Resp:  [18-20] (P) 18 (02/12 1110) BP: (99-131)/(56-80) (P) 121/94 (02/12 1110) SpO2:  [95 %-100 %] (P) 98 % (02/12 1110) Weight:  [64.2 kg] 64.2 kg (02/11 1555)  GENERAL: Laying in bed, does not respond to verbal stimulation. HEAD: - Normocephalic and atraumatic EENT: No OP obstruction, normal conjunctiva LUNGS - Normal respiratory effort without labored breathing CV - 2+ radial pulses, extremities warm without edema ABDOMEN - Soft, non-distended Ext: warm, well perfused  NEURO: Severely limited examination due to patient altered mental status, dementia, progressive mental decline.  Mental Status: alert with eyes open, no  vocalization, does not follow commands, restless movements of bilateral upper and lower extremities. No response to examiner questioning, increased restlessness with tactile stimuli. Noxious stimuli not applied due to patient with dementia and recent aggressive/agitated behavior towards staff requiring medication for behavior control. Speech/Language: speech: mute at this time.  Cranial Nerves:  II: PERRL 3 mm/brisk. Does not track examiner. Gaze midline and conjugate. Does not attend to visual stimulation.  III, IV, VI: EOM unable to be assessed. Does not track examiner or attend to visual stimulation. Does not move eyes to voice or noxious stimuli.  V: Unable to assess secondary to patient mental status VII: Face appears symmetric resting  VIII: Unable to assess due to altered mental staus IX, X: Unable to assess XI: Head appears midline XII: Unable to assess Motor: Patient has anti-gravity movement present in all extremities with restless movements laying in bed.  Tone is intermittently increased with passive movements of all extremities. Bulk is normal.  Sensation: Unable to assess though she becomes more restless with increased restless movements during physical examination.   Coordination: Unable to assess  DTRs: 2+ throughout; bilateral patellae, biceps, and brachioradialis Gait- deferred  Labs I have reviewed labs in epic and the results pertinent to this consultation are:  CBC    Component Value Date/Time   WBC 10.2 05/21/2020 1957   RBC 4.36 05/21/2020 1957   HGB 13.0 05/21/2020 1957   HCT 42.1 05/21/2020 1957   PLT 294 05/21/2020 1957   MCV 96.6 05/21/2020 1957   MCH 29.8 05/21/2020 1957   MCHC 30.9 05/21/2020 1957   RDW 13.1 05/21/2020 1957   CMP     Component Value Date/Time   NA 139 05/23/2020 1312   K 4.5 05/23/2020 1312   CL 108 05/23/2020 1312   CO2 16 (L) 05/23/2020 1312   GLUCOSE 69 (L) 05/23/2020 1312   BUN 20 05/23/2020 1312   CREATININE 1.11 (H)  05/23/2020 1312   CALCIUM 8.6 (L) 05/23/2020 1312   GFRNONAA 53 (L) 05/23/2020 1312   Imaging I have reviewed the images obtained: CT-scan of the brain  1. Similar position of the right frontal ventriculostomy catheter with slightly more decompressed ventricular system and no evidence of hydrocephalus. Correlate with evidence of over shunting. 2. Otherwise, similar chronic findings without evidence of acute intracranial abnormality. 3. Chronic left maxillary sinus disease with internal hyperdensity that may represent fungal colonization and/or inspissated secretions  MRI examination of the brain pending   Assessment: 75 year old female with dementia, multiple recent falls, and multiple witnessed syncopal episodes with neurological decline since admission to hospital 05/21/20. - Assessment reveals progressive cognitive decline in patient with history of dementia. Currently she is nonverbal, does not track examiner, and does not follow commands. Per family at baseline, she communicates with poor memory, has "word salad" often, and is able to ambulate without assistance, often pacing around her facility.  - Metabolic encephalopathy in the setting of dementia with AKI superimposed on CKD likely contributing to mental status change in patient. See below for other DDx of LBD.  - History of syncopal episodes with VP shunt malfunction. Imaging revealed VP shunt in correct placement and the imaging is without hydrocephalus at this time. CT head imaging in November 2020 with impression of "slitlike ventricles" consistent with decompressed ventricle presentation on current CT head. VP shunt malfunction low on the DDx list at this time. Family also states that she has had syncopal spells with multiple falls at baseline more recently; the syncopal spells have occurred outside of the context of shunt malfunction.  - She had an MRI in 2017 while shunt was in place. It is not a programmable shunt per family.  -  Patient with decreased PO intake and multiple episodes of hypoglycemia since admission. - Patient at high risk for delirium. Agitation/aggression with staff and monitoring devices requiring medication administration since admission; could be contributing to delirium.  - EEG obtained suggestive of moderate diffuse encephalopathy without seizures of definite epileptiform discharges. - Patient is on multiple medications known for increasing risk of falls and delirium including buspirone TID, decadron, Ativan 2-3 times daily PRN, seroquel BID, remeron, zoloft, and melatonin. - Cardiology work-up without evidence of cardiac etiology of syncopal episodes: patient without arrhythmia, unlikely to tolerate prolonged monitoring, echo without acute findings. - Given a history of formed visual hallucinations, gradual cognitive decline and bilateral leg movements seen during bedside evaluation while patient was asleep, but not while awake, there is concern for possible Lewy body dementia. She may have worsened this admission due to seroquel administration, as patients with LBD often have significantly worsened cognition  and motor functioning if administered antipsychotics.  - If she has LBD, then the most likely etiology for her current presentation would be a spontaneous cognitive fluctuation in the setting of LBD  Recommendations: - MRI brain pending - Continue management of comorbid conditions per primary team - Continue Aricept but and Namenda (Aricept improves cognition in dementia and Namenda helps with behavior control) - Try to minimize deliriogenic medications as much as possible (J Am Geriatr Soc. 2012 Apr;60(4):616-31): benzodiazepines, anticholinergics, diphenhydramine, antihistamines, narcotics, Ambien/Lunesta/Sonata etc.However, if sedation is necessary, can use low-dose IV Ativan.  - Environmental support for delirium: Lights on during the day, quiet dimly lit room at night - Avoid antipsychotics  given possible underlying Lewy body dementia. Discontinue Seroquel - Please call Neurology after MRI is completed.  - Consider a Neurosurgery consult to reassure family regarding shunt - Outpatient Neurology evaluation at a tertiary care dementia clinic such as at [facility] or [facility] and observe for possible improvement off Seroquel    Anibal Henderson, AGAC-NP [facility] Pager: [phone]  Electronically signed: Dr. Kerney Elbe

## 2020-05-24 NOTE — Progress Notes (Signed)
Manufacturing engineer [facility])  Referral received for hospice home in [place].  There is not a bed this evening to offer.  [facility] will reach out to family in am once bed status has been determined.  Venia Carbon RN, BSN, [facility] Liaison

## 2020-05-24 NOTE — Progress Notes (Signed)
PROGRESS NOTE    Zanylah Hardie  OVF:[id number] DOB: 1945/11/22 DOA: 05/21/2020 PCP: Ronita Hipps, MD   Chief Complaint  Patient presents with  . Loss of Consciousness    Brief Narrative:  75 year old lady from SNF, with prior h/o dementia, h/o VP shunt replaced in 2001,  Stage 3 a CKD, hypothyroidism, presents to ED from memory care unit after multiple syncopal episodes.  Extensive work up with echocardiogram, carotid duplex and EEG , CT negative for acute pathology.  MRI brain ordered for further evaluation. Cardiology recommended no further cardiac work up and no indication for long term monitoring. Neurology consulted as per family request.  In view of her advanced dementia, expressive aphasia, failure to thrive, multiple falls, h/o stroke in the past, palliative care consult for goals of care.    Assessment & Plan:   Principal Problem:   Syncope Active Problems:   Dementia (HCC)   CKD (chronic kidney disease), stage III (HCC)   Hypothyroidism   Recurrent syncope with multiple falls probably secondary to dehydration, differential include arrhythmias vs seizures vs orthostatic hypotension Versus hypoglycemia Initial CT of the head shows similar position of the right frontal ventriculostomy catheter with slightly more decompressed ventricular system and no evidence of hydrocephalus. similar chronic findings without evidence of acute intracranial abnormality.  MRI of the brain without contrast is pending Orthostatic vital signs normal admission. EEG suggestive of moderate diffuse encephalopathy, nonspecific etiology.No seizures or definite epileptiform discharges were seen throughout the recording.  Echocardiogram showed preserved LVEF and no wall motion abn.  EKG does not show any heart blocks.  Telemetry monitoring overnight is negative for arrhythmias.  Carotid duplex in the right ICA and  left ICA are consistent with a 1-39% stenosis.   Cardiology consulted,  recommended  no further cardiac work up and no indication for long term monitoring. Neurology consulted for recommendations.    Acute metabolic encephalopathy in the setting of moderate dementia Probably worsening delirium, confusion and mild AKI.  Pt not back to baseline . She is alert but confused.    Mild acute on stage 3a CKD: Creatinine improved with Hydration.  Creatinine on admission at 1.2 improved to 1.1 with fluids.    Hypothyroidism:  Resume synthroid.    Dementia:  Continue with Aricept and Namenda, is able to take oral.    Multiple episodes of hypoglycemia; from poor oral intake.   Last A1c is 5.4 and start the patient on dextrose normal saline fluids.    DVT prophylaxis: (Lovenox) Code Status: (Full code) Family Communication: discussed with daughter over the phone.  Disposition:   Status is: Inpatient  The patient will require care spanning > 2 midnights and should be moved to inpatient because: Ongoing diagnostic testing needed not appropriate for outpatient work up  Dispo: The patient is from: SNF              Anticipated d/c is to: SNF              Anticipated d/c date is: 3 days              Patient currently is not medically stable to d/c.   Difficult to place patient No       Level of care: Telemetry Medical Consultants:   Cardiology  Palliative care  neurology   Procedures:   Echocardiogram.  EEG  MRI of the brain without contrast   Antimicrobials:   None.    Subjective: Confused, cannot obtain any  ROS.   Objective: Vitals:   05/23/20 1848 05/23/20 2034 05/24/20 0600 05/24/20 1110  BP: 117/69 124/70 (!) 99/56 (!) (P) 121/94  Pulse: 91 (!) 107 81 (P) 61  Resp: 19 18 20  (P) 18  Temp: 98.5 F (36.9 C) 97.8 F (36.6 C) 97.6 F (36.4 C) (P) 99.3 F (37.4 C)  TempSrc:  Axillary Oral (P) Axillary  SpO2: 97% 97% 100% (P) 98%  Weight:        Intake/Output Summary (Last 24 hours) at 05/24/2020 1315 Last data filed  at 05/24/2020 0600 Gross per 24 hour  Intake 1064.17 ml  Output --  Net 1064.17 ml   Filed Weights   05/23/20 1555  Weight: 64.2 kg    Examination:  General exam: Alert, confused, not in any kind of distress. Respiratory system: Air entry fair, no wheezing or rhonchi Cardiovascular system: S1-S2 heard, regular rate rhythm, no JVD Gastrointestinal system: Abdomen is soft nontender bowel sounds normal Central nervous system: Confused, able to move all extremities Extremities: No pedal edema Skin: No rashes seen  psychiatry: Cannot be assessed   Data Reviewed: I have personally reviewed following labs and imaging studies  CBC: Recent Labs  Lab 05/21/20 1117 05/21/20 1957  WBC 9.5 10.2  HGB 12.4 13.0  HCT 40.8 42.1  MCV 97.6 96.6  PLT 248 196    Basic Metabolic Panel: Recent Labs  Lab 05/21/20 1117 05/21/20 1957 05/22/20 0645 05/23/20 1312  NA 141  --  137 139  K 3.8  --  4.7 4.5  CL 105  --  106 108  CO2 24  --  20* 16*  GLUCOSE 93  --  68* 69*  BUN 27*  --  23 20  CREATININE 1.23* 1.06* 1.05* 1.11*  CALCIUM 8.6*  --  8.4* 8.6*  MG  --   --   --  2.1    GFR: Estimated Creatinine Clearance: 35.3 mL/min (A) (by C-G formula based on SCr of 1.11 mg/dL (H)).  Liver Function Tests: No results for input(s): AST, ALT, ALKPHOS, BILITOT, PROT, ALBUMIN in the last 168 hours.  CBG: Recent Labs  Lab 05/23/20 1435 05/23/20 1718 05/23/20 2128 05/24/20 0649 05/24/20 1116  GLUCAP 133* 92 128* 92 75     Recent Results (from the past 240 hour(s))  SARS CORONAVIRUS 2 (TAT 6-24 HRS) Nasopharyngeal Urine, Clean Catch     Status: None   Collection Time: 05/21/20  7:57 PM   Specimen: Urine, Clean Catch; Nasopharyngeal  Result Value Ref Range Status   SARS Coronavirus 2 NEGATIVE NEGATIVE Final    Comment: (NOTE) SARS-CoV-2 target nucleic acids are NOT DETECTED.  The SARS-CoV-2 RNA is generally detectable in upper and lower respiratory specimens during the acute  phase of infection. Negative results do not preclude SARS-CoV-2 infection, do not rule out co-infections with other pathogens, and should not be used as the sole basis for treatment or other patient management decisions. Negative results must be combined with clinical observations, patient history, and epidemiological information. The expected result is Negative.  Fact Sheet for Patients: SugarRoll.be  Fact Sheet for Healthcare Providers: https://www.woods-mathews.com/  This test is not yet approved or cleared by the [place] FDA and  has been authorized for detection and/or diagnosis of SARS-CoV-2 by FDA under an Emergency Use Authorization (EUA). This EUA will remain  in effect (meaning this test can be used) for the duration of the COVID-19 declaration under Se ction 564(b)(1) of the Act, 21 U.S.C. section 360bbb-3(b)(1), unless  the authorization is terminated or revoked sooner.  Performed at [facility], [place] [address], [place], [place] [address]          Radiology Studies: EEG adult  Result Date: 06/04/20 Lora Havens, MD     2020-06-04 10:29 AM Patient Name: Kiamesha Samet MRN: [id number] Epilepsy Attending: Lora Havens Referring Physician/Provider: Dr Hosie Poisson Date: 05/22/2020 Duration: 24.28 mins Patient history: 75yo F with syncope. EEG to evaluate for seizure Level of alertness: Awake AEDs during EEG study: None Technical aspects: This EEG study was done with scalp electrodes positioned according to the 10-20 International system of electrode placement. Electrical activity was acquired at a sampling rate of 500Hz  and reviewed with a high frequency filter of 70Hz  and a low frequency filter of 1Hz . EEG data were recorded continuously and digitally stored. Description: No clear posterior dominant rhythm was seen.  EEG showed continuous generalized polymorphic mixed frequencies with predominantly 5 to 9 Hz  theta-alpha activity as well as 2 to 3 Hz delta slowing.  Single sharp transient was seen in left> right frontal region.  Hyperventilation and photic stimulation were not performed.   ABNORMALITY -Continuous slow, generalized IMPRESSION: This study is suggestive of moderate diffuse encephalopathy, nonspecific etiology. No seizures or definite epileptiform discharges were seen throughout the recording. [name]        Scheduled Meds: . donepezil  10 mg Oral QHS  . enoxaparin (LOVENOX) injection  40 mg Subcutaneous Q24H  . levothyroxine  25 mcg Oral Q0600  . memantine  10 mg Oral Daily  . sertraline  25 mg Oral Daily  . sodium chloride flush  3 mL Intravenous Q12H  . traZODone  50 mg Oral QHS   Continuous Infusions: . dextrose 5 % and 0.9% NaCl 75 mL/hr at 05/24/20 0121     LOS: 2 days       Hosie Poisson, MD [facility]   To contact the attending provider between 7A-7P or the covering provider during after hours 7P-7A, please log into the web site [email or web address] and access using universal [place] password for that web site. If you do not have the password, please call the hospital operator.  05/24/2020, 1:15 PM

## 2020-05-25 DIAGNOSIS — Z7189 Other specified counseling: Secondary | ICD-10-CM | POA: Diagnosis not present

## 2020-05-25 DIAGNOSIS — Z515 Encounter for palliative care: Secondary | ICD-10-CM | POA: Diagnosis not present

## 2020-05-25 DIAGNOSIS — N1831 Chronic kidney disease, stage 3a: Secondary | ICD-10-CM | POA: Diagnosis not present

## 2020-05-25 MED ORDER — LORAZEPAM 2 MG/ML IJ SOLN
1.0000 mg | Freq: Four times a day (QID) | INTRAMUSCULAR | Status: DC
  Administered 2020-05-25: 1 mg via INTRAVENOUS
  Filled 2020-05-25: qty 1

## 2020-05-25 MED ORDER — WHITE PETROLATUM EX OINT
TOPICAL_OINTMENT | CUTANEOUS | Status: AC
  Filled 2020-05-25: qty 28.35

## 2020-05-25 NOTE — Progress Notes (Addendum)
[facility] ([facility])  There is a bed for Mrs. Banghart at [facility].  Once consents are completed, transport can be arranged. This writer will update TOC once completed so transport can be arranged.  RN staff, please call report at any time to [phone], bed is assigned at this time.  Once d/c summary is completed, please fax to [phone].  Daughter Theadora Rama plans to leave to return home to gather personal items for her mother.  She requests that hospital staff call her at number on facesheet to let her know once ambulance has arrived.  Thank you, Venia Carbon RN, BSN, [facility] Liaison   **all necessary consents are completed and transport can be arranged to [facility].

## 2020-05-25 NOTE — Progress Notes (Signed)
   05/25/20 1057  Clinical Encounter Type  Visited With Patient and family together;Health care provider  Visit Type Initial  Referral From Nurse   Chaplain responded to a request from the nurse to visit with the patient's family, as patient has been transitioned to comfort care. Family seems to be fine at the moment, and their pastor will be coming up later today. Chaplain introduced spiritual care services. Spiritual care services available as needed.   Jeri Lager, Chaplain

## 2020-05-25 NOTE — TOC Transition Note (Signed)
Transition of Care [facility]) - CM/SW Discharge Note   Patient Details  Name: Renee Raymond MRN: [id number] Date of Birth: [birth year]  [facility]) CM/SW Contact:  Gabrielle Dare Phone Number: 05/25/2020, 1:26 PM   Clinical Narrative:    Patient will Discharge To: [facility] Date:05/25/20 Family Notified:yes, daughter, Piedad Climes, [phone] Transport By: Corey Harold   Per MD patient ready for DC to [facility] . RN, patient, patient's family, and facility notified of DC. Assessment, Fl2/Pasrr, and Discharge Summary sent to facility. RN given number for report [phone]). DC packet on chart. Ambulance transport requested for patient.   CSW signing off.  Reed Breech LCSWA [phone]     Final next level of care: Hospice Medical Facility Barriers to Discharge: No Barriers Identified   Patient Goals and CMS Choice        Discharge Placement              Patient chooses bed at:  [facility]) Patient to be transferred to facility by: [place] Name of family member notified: Piedad Climes, daughter Patient and family notified of of transfer: 05/25/20  Discharge Plan and Services                                     Social Determinants of Health (SDOH) Interventions     Readmission Risk Interventions No flowsheet data found.

## 2020-05-25 NOTE — Progress Notes (Signed)
   Palliative Medicine Inpatient Follow Up Note  Reason for consult:  Goals of Care "moderate dementia, hypoglycemia, recurrent syncopes, suspect she has poor oral intake, multiple falls at the memory care unit. failure to thrive."  HPI:  Per intake H&P --> Renee Raymondis a 74 y.o.femalewith medical history significant fordementia, CKD stage III, hypothyroidism who lives at a care unit of SNF and presents by EMS after syncopal episodes.   Palliative care was asked to get involved to discuss goals of care.  Today's Discussion (05/24/2020):  *Please note that this is a verbal dictation therefore any spelling or grammatical errors are due to the "[employer]" system interpretation.  I met with patients daughter, [name] at bedside. She shares with me that her mother had a restful night in the setting of receiving ativan. She expressed that this is very hard. I offered therapeutic support through listening. We reviewed how long Renee Raymond has suffered for. We discussed how much she has been through in her life.   Renee Raymond herself remains somnolent during our time together. She did open her right eye for a brief period though remains verbally unresponsive.  [name] shares with me that she has no preference regarding the hospice home that Renee Raymond goes to. The plan will be for her to transition to [facility] later today.   Questions and concerns addressed   Objective Assessment: Vital Signs Vitals:   05/24/20 1703 05/25/20 0526  BP: 134/61 134/81  Pulse: 84 (!) 103  Resp: 18 17  Temp: 98.8 F (37.1 C)   SpO2: 95% 95%    Intake/Output Summary (Last 24 hours) at 05/25/2020 0935 Last data filed at 05/24/2020 1900 Gross per 24 hour  Intake 0 ml  Output 200 ml  Net -200 ml   Last Weight  Most recent update: 05/23/2020  3:58 PM   Weight  64.2 kg (141 lb 8.6 oz)           Gen:  [name] in NAD HEENT: moist mucous membranes CV: Irregular rate and rhythm  PULM: clear  to auscultation bilaterally  ABD: soft/nontender  EXT: No edema  Neuro: Somnolent  SUMMARY OF RECOMMENDATIONS DNAR/DNI  Comfort Focused Care  Unrestricted visitation  TOC - Inpatient Hospice - [facility]  Maintain strict delirium precuations  Ongoing PMT support  Time Spent: 25 Greater than 50% of the time was spent in counseling and coordination of care ______________________________________________________________________________________    Palliative Medicine Team Team Cell Phone: [phone] Please utilize secure chat with additional questions, if there is no response within 30 minutes please call the above phone number  Palliative Medicine Team providers are available by phone from 7am to 7pm daily and can be reached through the team cell phone.  Should this patient require assistance outside of these hours, please call the patient's attending physician.     

## 2020-05-25 NOTE — Discharge Summary (Signed)
Physician Discharge Summary  Erna Brossard RSW:[id number] DOB: Apr 16, 1945 DOA: 05/21/2020  PCP: Ronita Hipps, MD  Admit date: 05/21/2020 Discharge date: 05/25/2020  Admitted From: Memory Care Unit.  Disposition:  [facility].   Recommendations for Outpatient Follow-up:  Follow up with Hospice MD as needed.   Discharge Condition: hospice.  CODE STATUS:comfort care Diet recommendation: comfort feeds.   Brief/Interim Summary: 75 year old lady from SNF, with prior h/o dementia, h/o VP shunt replaced in 2001,  Stage 3 a CKD, hypothyroidism, presents to ED from memory care unit after multiple syncopal episodes.  Extensive work up with echocardiogram, carotid duplex and EEG , CT negative for acute pathology.  MRI brain ordered for further evaluation. Cardiology recommended no further cardiac work up and no indication for long term monitoring. Neurology consulted as per family request.  In view of her advanced dementia, expressive aphasia, failure to thrive, multiple falls, h/o stroke in the past, palliative care consult for goals of care.   Discharge Diagnoses:  Principal Problem:   Syncope Active Problems:   Dementia ([place])   CKD (chronic kidney disease), stage III (HCC)   Hypothyroidism   Recurrent syncope with multiple falls probably secondary to dehydration, differential include arrhythmias vs seizures vs orthostatic hypotension Versus hypoglycemia Initial CT of the head shows similar position of the right frontal ventriculostomy catheter with slightly more decompressed ventricular system and no evidence of hydrocephalus. similar chronic findings without evidence of acute intracranial abnormality.  MRI of the brain without contrast is pending Orthostatic vital signs normal admission. EEG suggestive of moderate diffuse encephalopathy, nonspecific etiology.No seizures ordefiniteepileptiform discharges were seen throughout the recording.  Echocardiogram showed preserved LVEF and  no wall motion abn.  EKG does not show any heart blocks.  Telemetry monitoring overnight is negative for arrhythmias.  Carotid duplex in the right ICA and  left ICA are consistent with a 1-39% stenosis.   Cardiology consulted, recommended  no further cardiac work up and no indication for long term monitoring. Neurology consulted for recommendations.  In view of her advanced dementia, expressive aphasia, failure to thrive, multiple falls, h/o stroke in the past, palliative care consult for goals of care. she was transitioned to comfort measure after family discussions.    Acute metabolic encephalopathy in the setting of moderate dementia Probably worsening delirium, confusion and mild AKI.  Pt not back to baseline    Mild acute on stage 3a CKD: Creatinine improved with Hydration.  Creatinine on admission at 1.2 improved to 1.1 with fluids.    Hypothyroidism:     Dementia with behavior abnormalities.   Multiple episodes of hypoglycemia; from poor oral intake.   Last A1c is 5.4      Discharge Instructions  Discharge Instructions    Diet - low sodium heart healthy   Complete by: As directed    Increase activity slowly   Complete by: As directed    No wound care   Complete by: As directed      Allergies as of 05/25/2020      Reactions   Codeine Other (See Comments)   GI upset      Medication List    STOP taking these medications   busPIRone 10 MG tablet Commonly known as: BUSPAR   dexamethasone 0.75 MG tablet Commonly known as: DECADRON   divalproex 125 MG DR tablet Commonly known as: DEPAKOTE   docusate sodium 100 MG capsule Commonly known as: COLACE   Ensure Plus Liqd   Lactobacillus Tabs  LORazepam 0.5 MG tablet Commonly known as: ATIVAN   melatonin 1 MG Tabs tablet   mirtazapine 7.5 MG tablet Commonly known as: REMERON   pantoprazole 40 MG tablet Commonly known as: PROTONIX   QUEtiapine 200 MG tablet Commonly known as:  SEROQUEL   QUEtiapine 50 MG tablet Commonly known as: SEROQUEL   sertraline 25 MG tablet Commonly known as: ZOLOFT   traZODone 100 MG tablet Commonly known as: DESYREL     TAKE these medications   acetaminophen 325 MG tablet Commonly known as: TYLENOL Take 650 mg by mouth every 6 (six) hours as needed for mild pain.       Follow-up Information    Ronita Hipps, MD .   Specialty: Family Medicine Contact information: [address] [place] [address] [phone]        Minus Breeding, MD .   Specialty: Cardiology Contact information: [address] [place] [address] [phone]              Allergies  Allergen Reactions  . Codeine Other (See Comments)    GI upset    Consultations:  Palliative care   Cardiology  Neurology.    Procedures/Studies: CT HEAD WO CONTRAST  Result Date: 05/21/2020 CLINICAL DATA:  Mental status change. EXAM: CT HEAD WITHOUT CONTRAST TECHNIQUE: Contiguous axial images were obtained from the base of the skull through the vertex without intravenous contrast. COMPARISON:  None. FINDINGS: Brain: Similar position of a right frontal ventriculostomy catheter with the tip at the right frontal horn. Similar hypoattenuation surrounding the shunt catheter. Visualized portions of the shunt catheter appear intact. The ventricular system is slightly more decompressed than on the prior without evidence of hydrocephalus. Similar encephalomalacia subjacent to a right parietal burr hole. No evidence of acute large vascular territory infarct. No acute hemorrhage. No mass lesion or abnormal mass effect. Basal cisterns are patent. Similar additional patchy white matter hypoattenuation, most likely related to chronic microvascular ischemia. Vascular: No hyperdense vessel identified. Calcific atherosclerosis. Skull: Right frontal and right parietal burr holes. No acute fracture. Sinuses/Orbits: Complete opacification of the visualized  left maxillary sinus with internal hyperdensity. Unremarkable orbits. Other: No mastoid effusions. IMPRESSION: 1. Similar position of the right frontal ventriculostomy catheter with slightly more decompressed ventricular system and no evidence of hydrocephalus. Correlate with evidence of over shunting. 2. Otherwise, similar chronic findings without evidence of acute intracranial abnormality. 3. Chronic left maxillary sinus disease with internal hyperdensity that may represent fungal colonization and/or inspissated secretions. Electronically Signed   By: Margaretha Sheffield MD   On: 05/21/2020 12:29   DG Chest Port 1 View  Result Date: 05/21/2020 CLINICAL DATA:  Syncopal episode EXAM: PORTABLE CHEST 1 VIEW COMPARISON:  04/20/2020 FINDINGS: The patient has taken a poor inspiration. Allowing for that, the heart and mediastinum are normal. The lungs are clear. The vascularity is normal. No effusions. Distant shoulder replacement on the left. Chronic degenerative changes of the right shoulder. IMPRESSION: Poor inspiration. No active disease. Electronically Signed   By: Nelson Chimes M.D.   On: 05/21/2020 16:10   EEG adult  Result Date: 05/23/2020 Lora Havens, MD     05/23/2020 10:29 AM Patient Name: Marguarite Markov MRN: [id number] Epilepsy Attending: Lora Havens Referring Physician/Provider: Dr Hosie Poisson Date: 05/22/2020 Duration: 24.28 mins Patient history: 75yo F with syncope. EEG to evaluate for seizure Level of alertness: Awake AEDs during EEG study: None Technical aspects: This EEG study was done with scalp electrodes positioned  according to the 10-20 International system of electrode placement. Electrical activity was acquired at a sampling rate of 500Hz  and reviewed with a high frequency filter of 70Hz  and a low frequency filter of 1Hz . EEG data were recorded continuously and digitally stored. Description: No clear posterior dominant rhythm was seen.  EEG showed continuous generalized  polymorphic mixed frequencies with predominantly 5 to 9 Hz theta-alpha activity as well as 2 to 3 Hz delta slowing.  Single sharp transient was seen in left> right frontal region.  Hyperventilation and photic stimulation were not performed.   ABNORMALITY -Continuous slow, generalized IMPRESSION: This study is suggestive of moderate diffuse encephalopathy, nonspecific etiology. No seizures or definite epileptiform discharges were seen throughout the recording. Lora Havens   ECHOCARDIOGRAM COMPLETE  Result Date: 05/22/2020    ECHOCARDIOGRAM REPORT   Patient Name:   ANACAREN KOHAN Date of Exam: 05/22/2020 Medical Rec #:  [id number]            Height:       57.0 in Accession #:    [id number]           Weight:       139.1 lb Date of Birth:  March 04, 1946            BSA:          1.541 m Patient Age:    33 years             BP:           98/78 mmHg Patient Gender: F                    HR:           73 bpm. Exam Location:  Inpatient Procedure: 2D Echo, Color Doppler and Cardiac Doppler Indications:    Syncope 780.2 / R55  History:        Patient has no prior history of Echocardiogram examinations.                 Signs/Symptoms:Syncope.  Sonographer:    Bernadene Person RDCS Referring Phys: [id number] [place]  1. Left ventricular ejection fraction, by estimation, is 60 to 65%. The left ventricle has normal function. The left ventricle has no regional wall motion abnormalities. Left ventricular diastolic parameters are indeterminate.  2. Right ventricular systolic function is normal. The right ventricular size is normal. There is normal pulmonary artery systolic pressure.  3. Left atrial size was mildly dilated.  4. The mitral valve is normal in structure. Trivial mitral valve regurgitation.  5. The aortic valve is tricuspid. Aortic valve regurgitation is trivial.  6. The inferior vena cava is normal in size with greater than 50% respiratory variability, suggesting right atrial pressure of 3  mmHg. Comparison(s): No prior Echocardiogram. Conclusion(s)/Recommendation(s): Otherwise normal echocardiogram, with minor abnormalities described in the report. FINDINGS  Left Ventricle: Left ventricular ejection fraction, by estimation, is 60 to 65%. The left ventricle has normal function. The left ventricle has no regional wall motion abnormalities. The left ventricular internal cavity size was normal in size. There is  no left ventricular hypertrophy. Left ventricular diastolic parameters are indeterminate. Right Ventricle: The right ventricular size is normal. Right vetricular wall thickness was not well visualized. Right ventricular systolic function is normal. There is normal pulmonary artery systolic pressure. The tricuspid regurgitant velocity is 2.20 m/s, and with an assumed right atrial pressure of 3 mmHg, the estimated right ventricular systolic pressure is 09.3 mmHg.  Left Atrium: Left atrial size was mildly dilated. Right Atrium: Right atrial size was normal in size. Pericardium: There is no evidence of pericardial effusion. Presence of pericardial fat pad. Mitral Valve: The mitral valve is normal in structure. Trivial mitral valve regurgitation. Tricuspid Valve: The tricuspid valve is normal in structure. Tricuspid valve regurgitation is trivial. No evidence of tricuspid stenosis. Aortic Valve: The aortic valve is tricuspid. Aortic valve regurgitation is trivial. Pulmonic Valve: The pulmonic valve was grossly normal. Pulmonic valve regurgitation is not visualized. No evidence of pulmonic stenosis. Aorta: The aortic root, ascending aorta and aortic arch are all structurally normal, with no evidence of dilitation or obstruction. Venous: The inferior vena cava is normal in size with greater than 50% respiratory variability, suggesting right atrial pressure of 3 mmHg. IAS/Shunts: The atrial septum is grossly normal.  LEFT VENTRICLE PLAX 2D LVIDd:         3.60 cm  Diastology LVIDs:         2.10 cm  LV e'  medial:    6.31 cm/s LV PW:         0.80 cm  LV E/e' medial:  9.6 LV IVS:        0.60 cm  LV e' lateral:   8.81 cm/s LVOT diam:     1.70 cm  LV E/e' lateral: 6.8 LV SV:         57 LV SV Index:   37 LVOT Area:     2.27 cm  RIGHT VENTRICLE RV S prime:     12.50 cm/s TAPSE (M-mode): 1.0 cm LEFT ATRIUM           Index       RIGHT ATRIUM           Index LA diam:      2.50 cm 1.62 cm/m  RA Area:     11.20 cm LA Vol (A2C): 14.6 ml 9.47 ml/m  RA Volume:   24.50 ml  15.89 ml/m LA Vol (A4C): 45.5 ml 29.52 ml/m  AORTIC VALVE LVOT Vmax:   118.00 cm/s LVOT Vmean:  84.000 cm/s LVOT VTI:    0.253 m  AORTA Ao Root diam: 2.80 cm Ao Asc diam:  3.00 cm MITRAL VALVE               TRICUSPID VALVE MV Area (PHT): 2.80 cm    TR Peak grad:   19.4 mmHg MV Decel Time: 271 msec    TR Vmax:        220.00 cm/s MV E velocity: 60.30 cm/s MV A velocity: 64.40 cm/s  SHUNTS MV E/A ratio:  0.94        Systemic VTI:  0.25 m                            Systemic Diam: 1.70 cm Buford Dresser MD Electronically signed by Buford Dresser MD Signature Date/Time: 05/22/2020/1:33:40 PM    Final    VAS US CAROTID  Result Date: 05/22/2020 Carotid Arterial Duplex Study Indications:       Syncope. Risk Factors:      No history of smoking. Other Factors:     Dementia. Comparison Study:  No previous Performing Technologist: Vonzell Schlatter RVT  Examination Guidelines: A complete evaluation includes B-mode imaging, spectral Doppler, color Doppler, and power Doppler as needed of all accessible portions of each vessel. Bilateral testing is considered an integral part of a complete examination. Limited examinations for  reoccurring indications may be performed as noted.  Right Carotid Findings: +----------+--------+--------+--------+------------------+--------+           PSV cm/sEDV cm/sStenosisPlaque DescriptionComments +----------+--------+--------+--------+------------------+--------+ CCA Prox  76      23                                          +----------+--------+--------+--------+------------------+--------+ CCA Distal80      23                                         +----------+--------+--------+--------+------------------+--------+ ICA Prox  38      11      1-39%   heterogenous               +----------+--------+--------+--------+------------------+--------+ ICA Distal55      22                                         +----------+--------+--------+--------+------------------+--------+ ECA       76      15                                         +----------+--------+--------+--------+------------------+--------+ +----------+--------+-------+--------+-------------------+           PSV cm/sEDV cmsDescribeArm Pressure (mmHG) +----------+--------+-------+--------+-------------------+ [id number]                                         +----------+--------+-------+--------+-------------------+ +---------+--------+--+--------+--+ VertebralPSV cm/s33EDV cm/s10 +---------+--------+--+--------+--+  Left Carotid Findings: +----------+--------+--------+--------+------------------+--------+           PSV cm/sEDV cm/sStenosisPlaque DescriptionComments +----------+--------+--------+--------+------------------+--------+ CCA Prox  71      14                                         +----------+--------+--------+--------+------------------+--------+ CCA Distal53      15                                         +----------+--------+--------+--------+------------------+--------+ ICA Prox  45      14      1-39%   heterogenous               +----------+--------+--------+--------+------------------+--------+ ICA Distal54      17                                         +----------+--------+--------+--------+------------------+--------+ ECA       77      13                                         +----------+--------+--------+--------+------------------+--------+  +----------+--------+--------+--------+-------------------+  PSV cm/sEDV cm/sDescribeArm Pressure (mmHG) +----------+--------+--------+--------+-------------------+ [id number]                                          +----------+--------+--------+--------+-------------------+ +---------+--------+--+--------+--+ VertebralPSV cm/s38EDV cm/s11 +---------+--------+--+--------+--+   Summary: Right Carotid: Velocities in the right ICA are consistent with a 1-39% stenosis. Left Carotid: Velocities in the left ICA are consistent with a 1-39% stenosis. Vertebrals:  Bilateral vertebral arteries demonstrate antegrade flow. Subclavians: Normal flow hemodynamics were seen in bilateral subclavian              arteries. *See table(s) above for measurements and observations.  Electronically signed by Servando Snare MD on 05/22/2020 at 1:00:16 PM.    Final        Subjective:  Non verbal.  Discharge Exam: Vitals:   05/25/20 0526 05/25/20 1130  BP: 134/81 103/65  Pulse: (!) 103 (!) 105  Resp: 17 18  Temp:    SpO2: 95% (!) 88%   Vitals:   05/24/20 1110 05/24/20 1703 05/25/20 0526 05/25/20 1130  BP: (!) (P) 121/94 134/61 134/81 103/65  Pulse: (P) 61 84 (!) 103 (!) 105  Resp: (P) 18 18 17 18   Temp: (P) 99.3 F (37.4 C) 98.8 F (37.1 C)    TempSrc: (P) Axillary Oral    SpO2: (P) 98% 95% 95% (!) 88%  Weight:        General: Pt is comfortable.  Cardiovascular: RRR, S1/S2 +, no rubs, no gallops Respiratory: CTA bilaterally, no wheezing, no rhonchi Abdominal: Soft, NT, ND, bowel sounds + Extremities: no edema, no cyanosis    The results of significant diagnostics from this hospitalization (including imaging, microbiology, ancillary and laboratory) are listed below for reference.     Microbiology: Recent Results (from the past 240 hour(s))  SARS CORONAVIRUS 2 (TAT 6-24 HRS) Nasopharyngeal Urine, Clean Catch     Status: None   Collection Time: 05/21/20  7:57 PM   Specimen:  Urine, Clean Catch; Nasopharyngeal  Result Value Ref Range Status   SARS Coronavirus 2 NEGATIVE NEGATIVE Final    Comment: (NOTE) SARS-CoV-2 target nucleic acids are NOT DETECTED.  The SARS-CoV-2 RNA is generally detectable in upper and lower respiratory specimens during the acute phase of infection. Negative results do not preclude SARS-CoV-2 infection, do not rule out co-infections with other pathogens, and should not be used as the sole basis for treatment or other patient management decisions. Negative results must be combined with clinical observations, patient history, and epidemiological information. The expected result is Negative.  Fact Sheet for Patients: SugarRoll.be  Fact Sheet for Healthcare Providers: https://www.woods-mathews.com/  This test is not yet approved or cleared by the [place] FDA and  has been authorized for detection and/or diagnosis of SARS-CoV-2 by FDA under an Emergency Use Authorization (EUA). This EUA will remain  in effect (meaning this test can be used) for the duration of the COVID-19 declaration under Se ction 564(b)(1) of the Act, 21 U.S.C. section 360bbb-3(b)(1), unless the authorization is terminated or revoked sooner.  Performed at [facility], [place] [address] [place], [place] [address]      Labs: BNP (last 3 results) No results for input(s): BNP in the last 8760 hours. Basic Metabolic Panel: Recent Labs  Lab 05/21/20 1117 05/21/20 1957 05/22/20 0645 05/23/20 1312  NA 141  --  137 139  K 3.8  --  4.7 4.5  CL  105  --  106 108  CO2 24  --  20* 16*  GLUCOSE 93  --  68* 69*  BUN 27*  --  23 20  CREATININE 1.23* 1.06* 1.05* 1.11*  CALCIUM 8.6*  --  8.4* 8.6*  MG  --   --   --  2.1   Liver Function Tests: No results for input(s): AST, ALT, ALKPHOS, BILITOT, PROT, ALBUMIN in the last 168 hours. No results for input(s): LIPASE, AMYLASE in the last 168 hours. No results for  input(s): AMMONIA in the last 168 hours. CBC: Recent Labs  Lab 05/21/20 1117 05/21/20 1957  WBC 9.5 10.2  HGB 12.4 13.0  HCT 40.8 42.1  MCV 97.6 96.6  PLT 248 294   Cardiac Enzymes: No results for input(s): CKTOTAL, CKMB, CKMBINDEX, TROPONINI in the last 168 hours. BNP: Invalid input(s): POCBNP CBG: Recent Labs  Lab 05/23/20 1718 05/23/20 2128 05/24/20 0649 05/24/20 1116 05/24/20 1706  GLUCAP 92 128* 92 75 72   D-Dimer No results for input(s): DDIMER in the last 72 hours. Hgb A1c Recent Labs    05/23/20 1520  HGBA1C 5.4   Lipid Profile No results for input(s): CHOL, HDL, LDLCALC, TRIG, CHOLHDL, LDLDIRECT in the last 72 hours. Thyroid function studies No results for input(s): TSH, T4TOTAL, T3FREE, THYROIDAB in the last 72 hours.  Invalid input(s): FREET3 Anemia work up No results for input(s): VITAMINB12, FOLATE, FERRITIN, TIBC, IRON, RETICCTPCT in the last 72 hours. Urinalysis    Component Value Date/Time   COLORURINE YELLOW 05/21/2020 2037   APPEARANCEUR CLEAR 05/21/2020 2037   LABSPEC 1.020 05/21/2020 2037   PHURINE 7.5 05/21/2020 2037   GLUCOSEU NEGATIVE 05/21/2020 2037   HGBUR NEGATIVE 05/21/2020 2037   [place] NEGATIVE 05/21/2020 2037   [place] NEGATIVE 05/21/2020 2037   PROTEINUR NEGATIVE 05/21/2020 2037   NITRITE NEGATIVE 05/21/2020 2037   LEUKOCYTESUR TRACE (A) 05/21/2020 2037   Sepsis Labs Invalid input(s): PROCALCITONIN,  WBC,  LACTICIDVEN Microbiology Recent Results (from the past 240 hour(s))  SARS CORONAVIRUS 2 (TAT 6-24 HRS) Nasopharyngeal Urine, Clean Catch     Status: None   Collection Time: 05/21/20  7:57 PM   Specimen: Urine, Clean Catch; Nasopharyngeal  Result Value Ref Range Status   SARS Coronavirus 2 NEGATIVE NEGATIVE Final    Comment: (NOTE) SARS-CoV-2 target nucleic acids are NOT DETECTED.  The SARS-CoV-2 RNA is generally detectable in upper and lower respiratory specimens during the acute phase of infection.  Negative results do not preclude SARS-CoV-2 infection, do not rule out co-infections with other pathogens, and should not be used as the sole basis for treatment or other patient management decisions. Negative results must be combined with clinical observations, patient history, and epidemiological information. The expected result is Negative.  Fact Sheet for Patients: SugarRoll.be  Fact Sheet for Healthcare Providers: https://www.woods-mathews.com/  This test is not yet approved or cleared by the [place] FDA and  has been authorized for detection and/or diagnosis of SARS-CoV-2 by FDA under an Emergency Use Authorization (EUA). This EUA will remain  in effect (meaning this test can be used) for the duration of the COVID-19 declaration under Se ction 564(b)(1) of the Act, 21 U.S.C. section 360bbb-3(b)(1), unless the authorization is terminated or revoked sooner.  Performed at [facility], [place] [address] [place], [place] [address]      Time coordinating discharge: 33 minutes.   SIGNED:   Hosie Poisson, MD  [facility]

## 2020-05-25 NOTE — Progress Notes (Signed)
Report given to Renee Raymond at [facility]. 

## 2020-06-10 DEATH — deceased

## 2022-04-05 IMAGING — CT CT PELVIS W/O CM
2 of 3 series · 15 of 46 positions shown, 17 images · non-contrast
Comparison: None.

CLINICAL DATA: Pain status post fall

EXAM:
CT PELVIS WITHOUT CONTRAST
TECHNIQUE: Multidetector CT imaging of the pelvis was performed following the
standard protocol without intravenous contrast.

[Series 5: axial soft tissue (person_name) · axial · 0.76mm/px · z∈[-376,-182]mm · 12 of 113 slices shown, 14 images]
[im 8/113  soft-tissue]
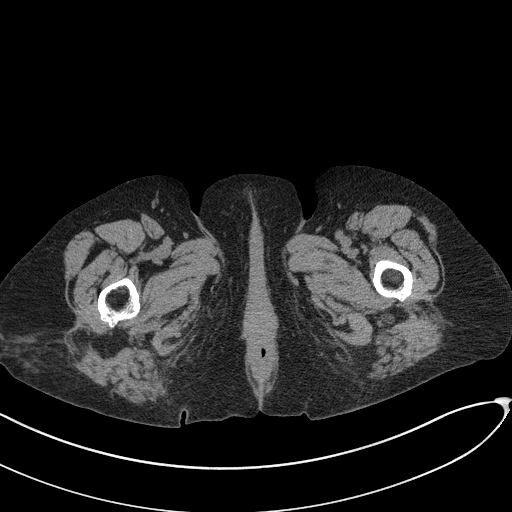
[im 8/113  bone]
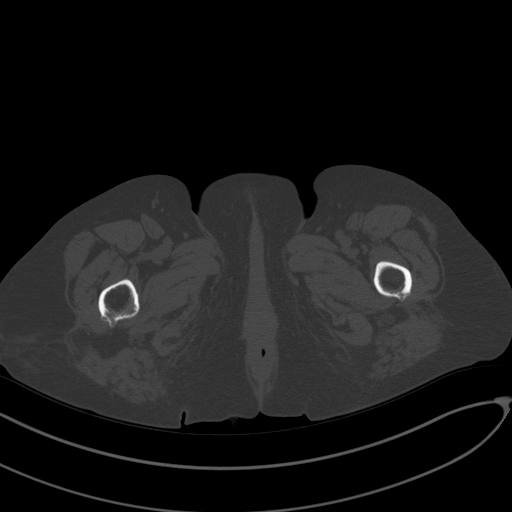
[im 15/113  soft-tissue]
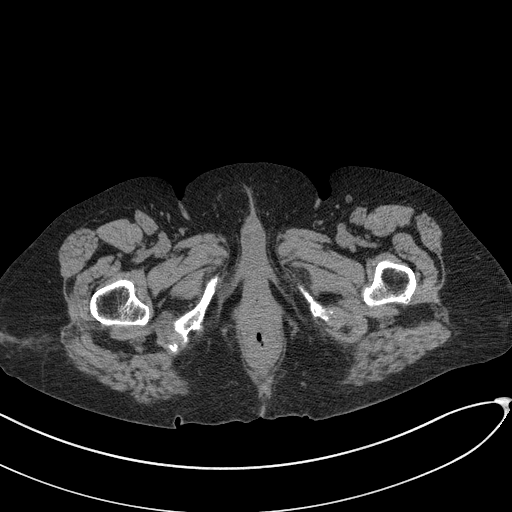
[im 26/113  soft-tissue]
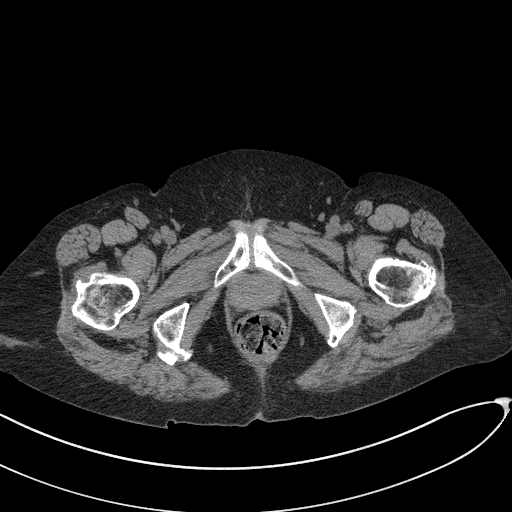
[im 33/113  soft-tissue]
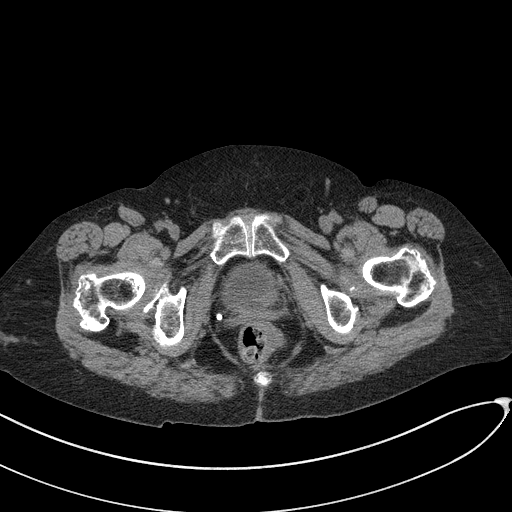
[im 44/113  soft-tissue]
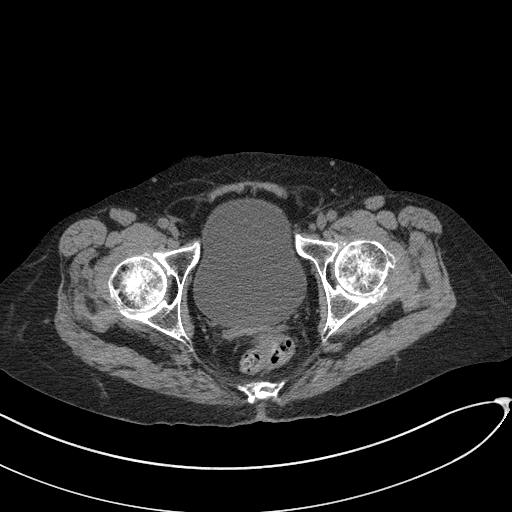
[im 51/113  soft-tissue]
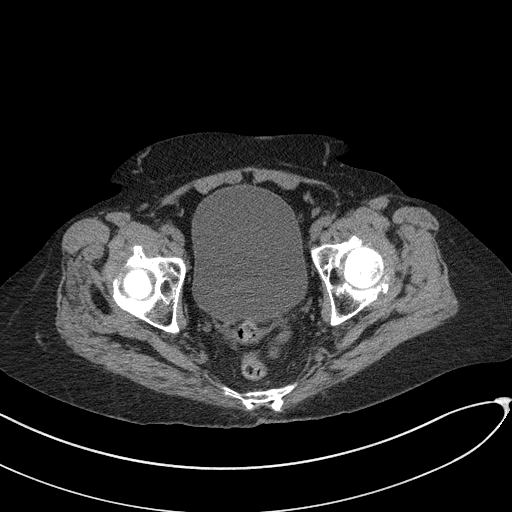
[im 62/113  soft-tissue]
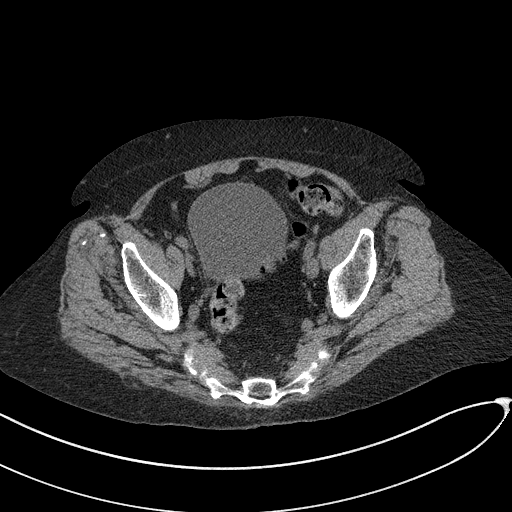
[im 69/113  soft-tissue]
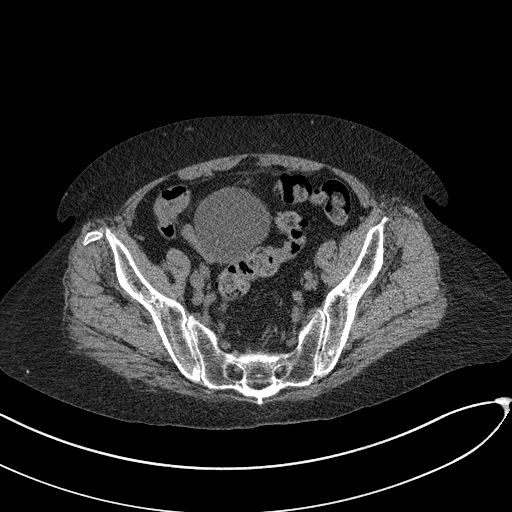
[im 80/113  soft-tissue]
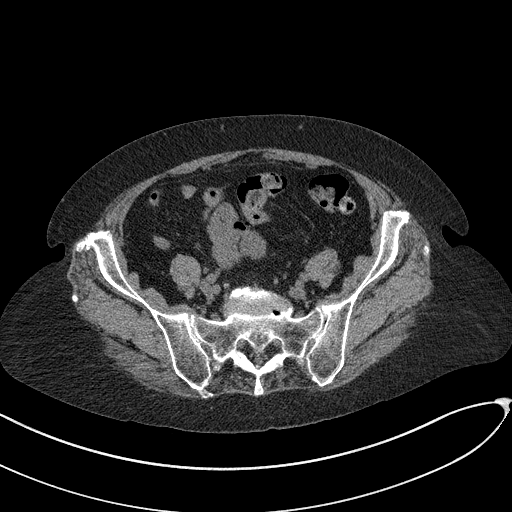
[im 80/113  bone]
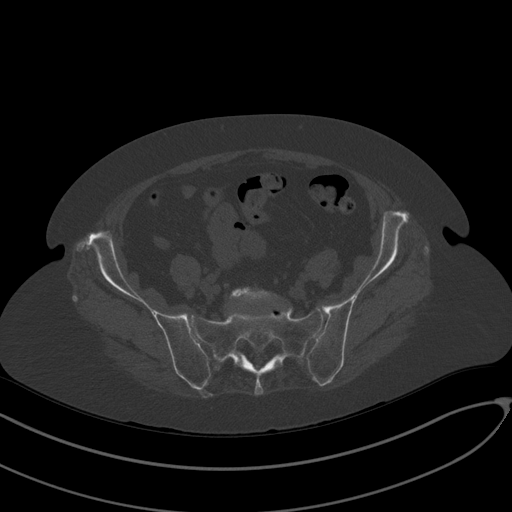
[im 87/113  soft-tissue]
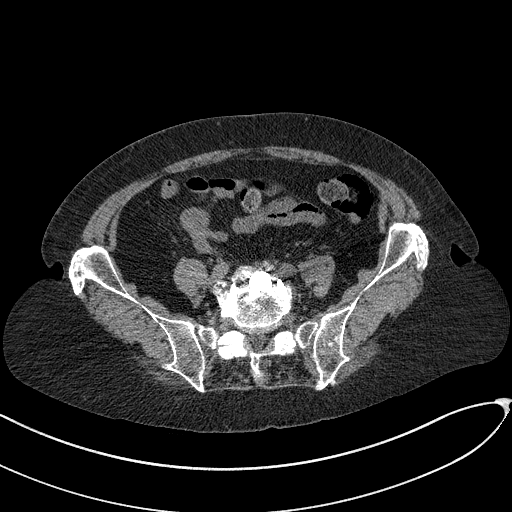
[im 98/113  soft-tissue]
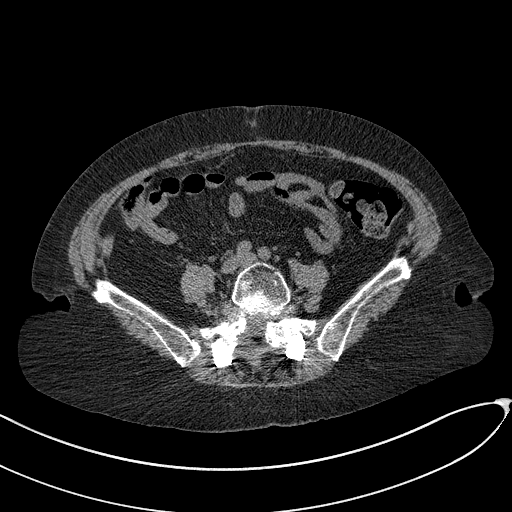
[im 105/113  soft-tissue]
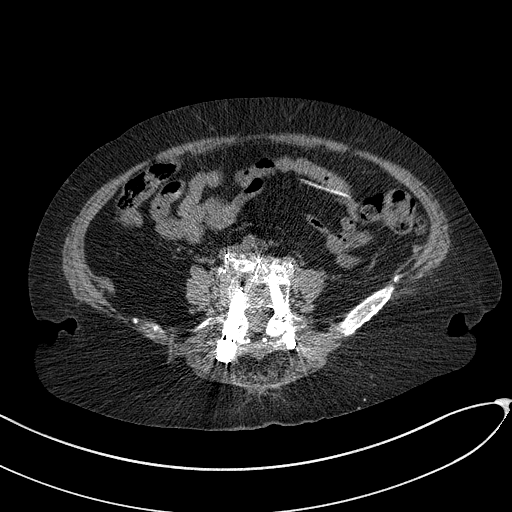

[Series 8: coronal st · coronal · 0.46mm/px · 3 of 116 slices shown]
[im 39/116  soft-tissue]
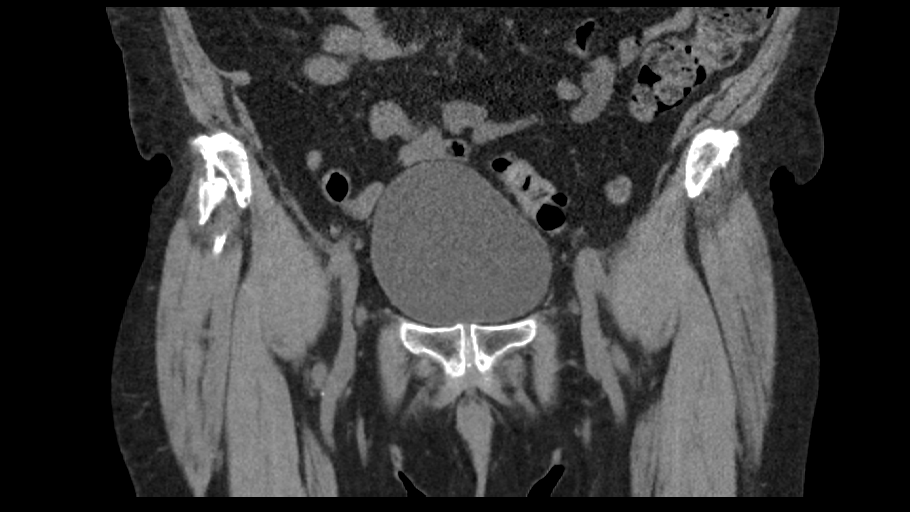
[im 52/116  soft-tissue]
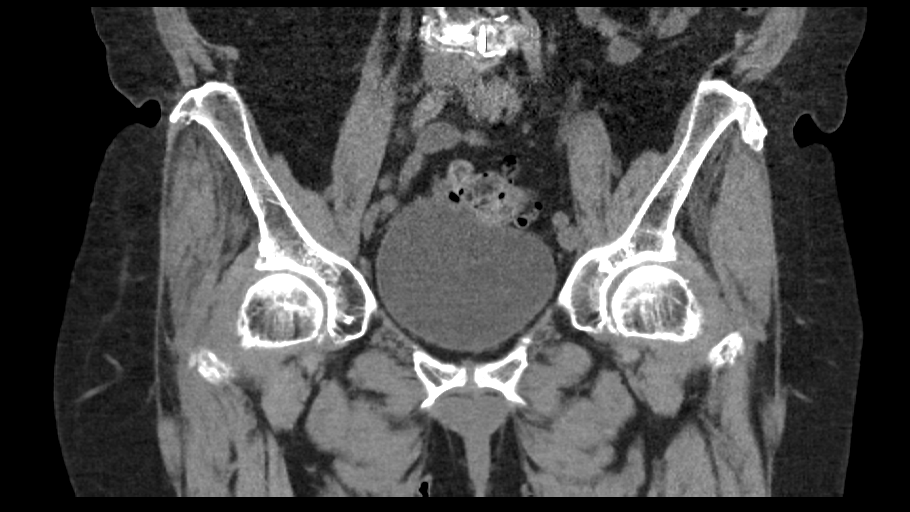
[im 64/116  soft-tissue]
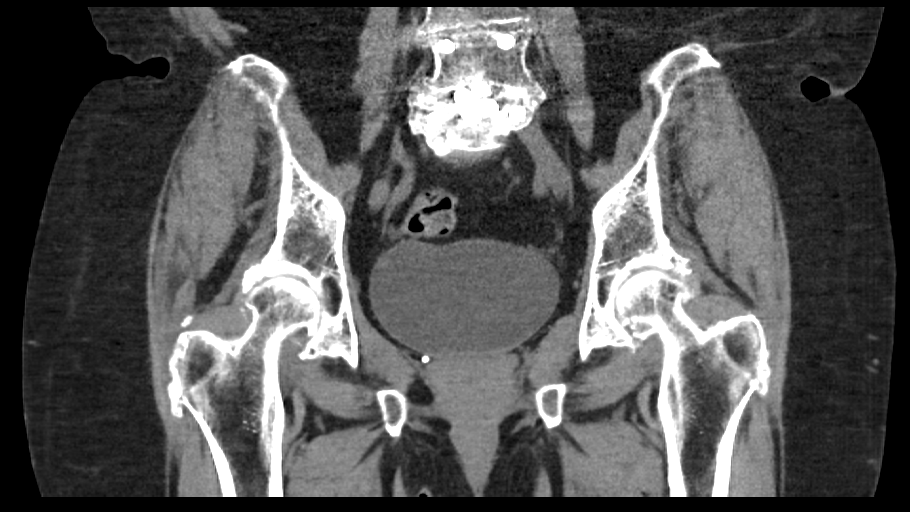

[15 of 46 positions shown; findings below may reference images not displayed]

FINDINGS: Urinary Tract:  The urinary bladder is significantly distended.

Bowel: There are scattered colonic diverticula without CT evidence
for diverticulitis involving the visualized portions of the colon.
The partially visualized VP shunt catheter is noted.

Vascular/Lymphatic: Vascular calcifications are noted of the distal
abdominal aorta. There are no pathologically enlarged lymph nodes.

Reproductive:  The patient is status post prior hysterectomy.

Other:  None.

Musculoskeletal: The patient is status post prior posterior fusion
of the lower lumbar spine. There is an interbody spacer at the L4-L5
level. There are degenerative changes of both hips. There is no
evidence for an acute displaced fracture or dislocation. There is
subcutaneous fat stranding involving the right gluteal region
overlying the proximal right hip.
IMPRESSION: 1. No evidence for an acute displaced fracture or dislocation.
2. Subcutaneous fat stranding involving the right gluteal region
overlying the proximal right hip. This may represent a soft tissue
contusion in the appropriate clinical setting.
3. Significantly distended urinary bladder.

Aortic Atherosclerosis ([id number]).
